# Patient Record
Sex: Male | Born: 1950 | Race: Black or African American | Hispanic: No | Marital: Married | Smoking: Never smoker
Health system: Southern US, Community
[De-identification: ages and names within clinical notes are randomized; demographics above are authoritative.]

## PROBLEM LIST (undated history)

## (undated) DIAGNOSIS — E119 Type 2 diabetes mellitus without complications: Secondary | ICD-10-CM

## (undated) DIAGNOSIS — I10 Essential (primary) hypertension: Secondary | ICD-10-CM

## (undated) DIAGNOSIS — Z8739 Personal history of other diseases of the musculoskeletal system and connective tissue: Secondary | ICD-10-CM

## (undated) DIAGNOSIS — E785 Hyperlipidemia, unspecified: Secondary | ICD-10-CM

## (undated) DIAGNOSIS — C61 Malignant neoplasm of prostate: Secondary | ICD-10-CM

## (undated) DIAGNOSIS — R972 Elevated prostate specific antigen [PSA]: Secondary | ICD-10-CM

## (undated) DIAGNOSIS — N189 Chronic kidney disease, unspecified: Secondary | ICD-10-CM

## (undated) HISTORY — PX: NO PAST SURGERIES: SHX2092

---

## 2003-12-11 ENCOUNTER — Ambulatory Visit (HOSPITAL_COMMUNITY): Admission: RE | Admit: 2003-12-11 | Discharge: 2003-12-11 | Payer: Self-pay | Admitting: Family Medicine

## 2003-12-11 IMAGING — CR DG WRIST COMPLETE 3+V*L*
1 series · 1 of 1 positions shown · non-contrast
Comparison: none

CLINICAL DATA: Patient with swollen left wrist.  No known injury.  
 LEFT WRIST, FOUR VIEWS
 There is mild diffuse soft tissue swelling.  There is bony spurring particularly in the lateral portion of the left wrist.  No definite areas of soft tissue calcification.  No fracture.  
 IMPRESSION
 1.  Mild soft tissue swelling.  No acute abnormality. 
 2.  Degenerative changes are noted particularly laterally.

[view not recorded]
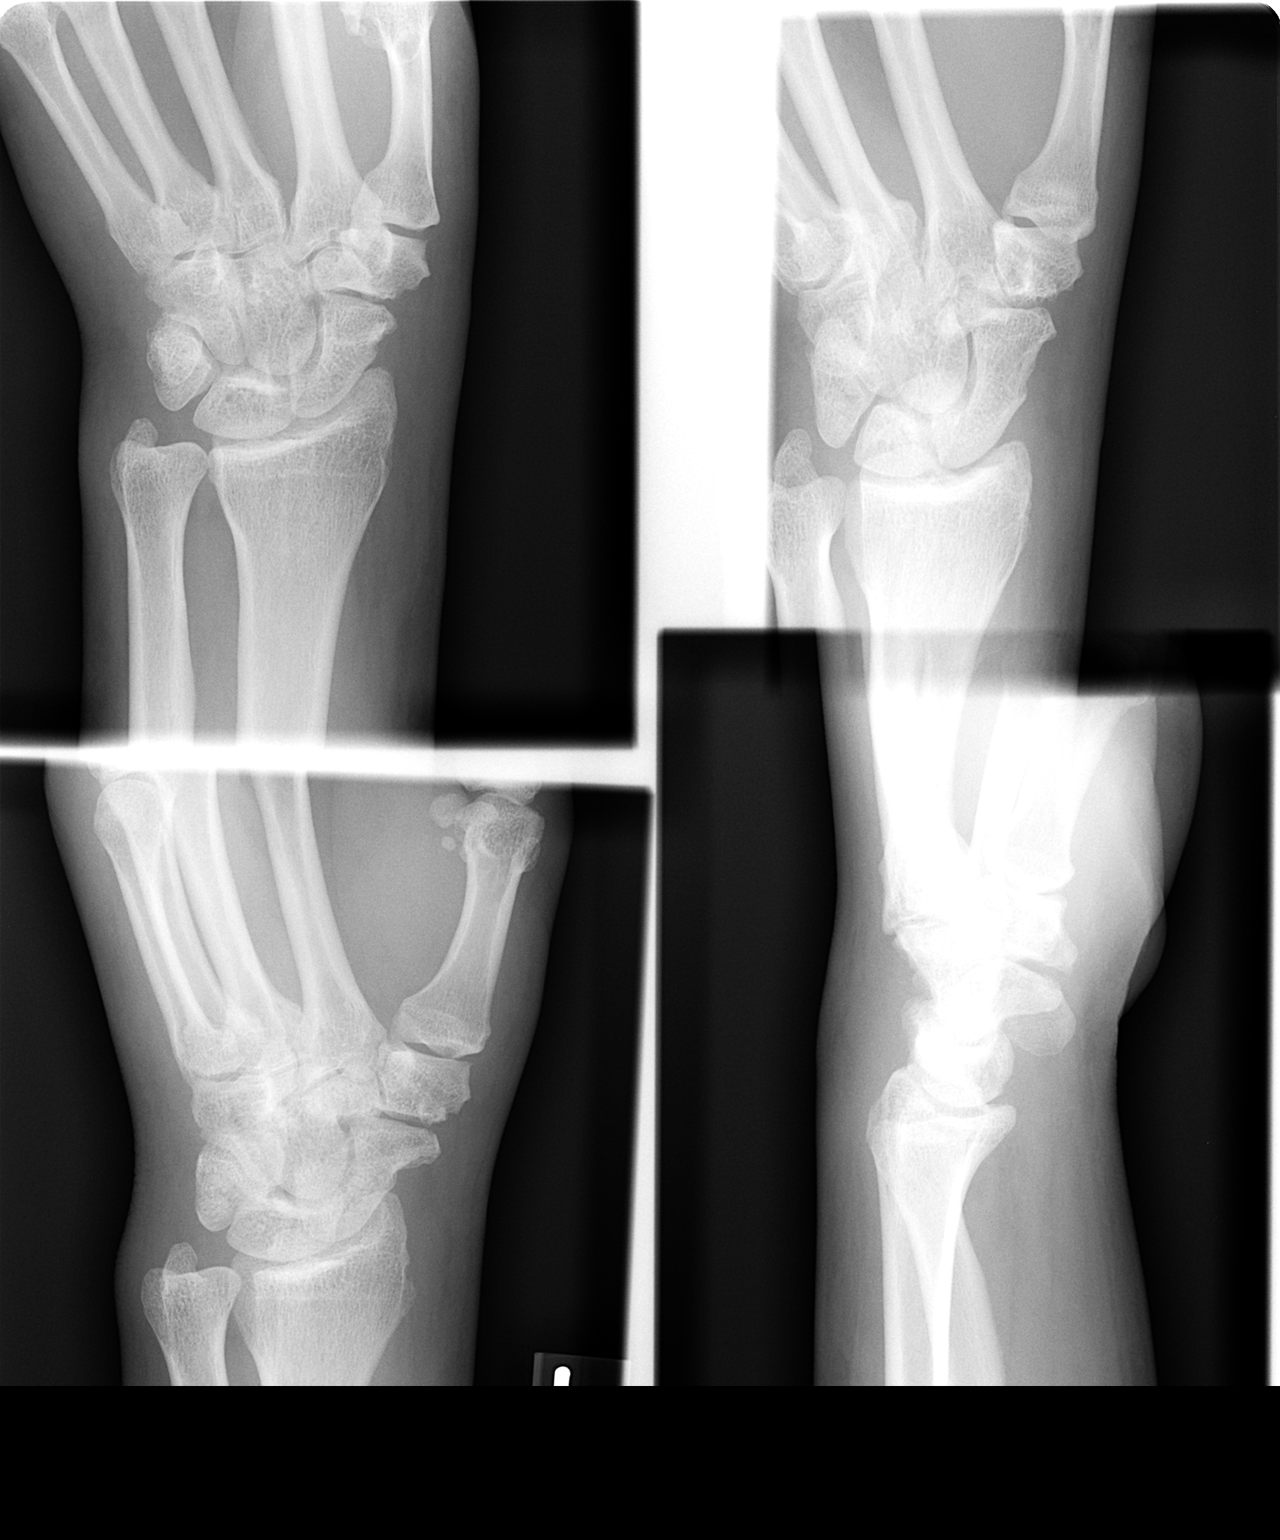

[1 of 1 positions shown; findings below may reference images not displayed]

## 2008-06-11 IMAGING — CR DG CHEST 2V
2 series · 2 of 2 positions shown · non-contrast
Comparison: None

CLINICAL DATA: Chest pain.  Abnormal EKG.

CHEST - 2 VIEW

[w chest pa]
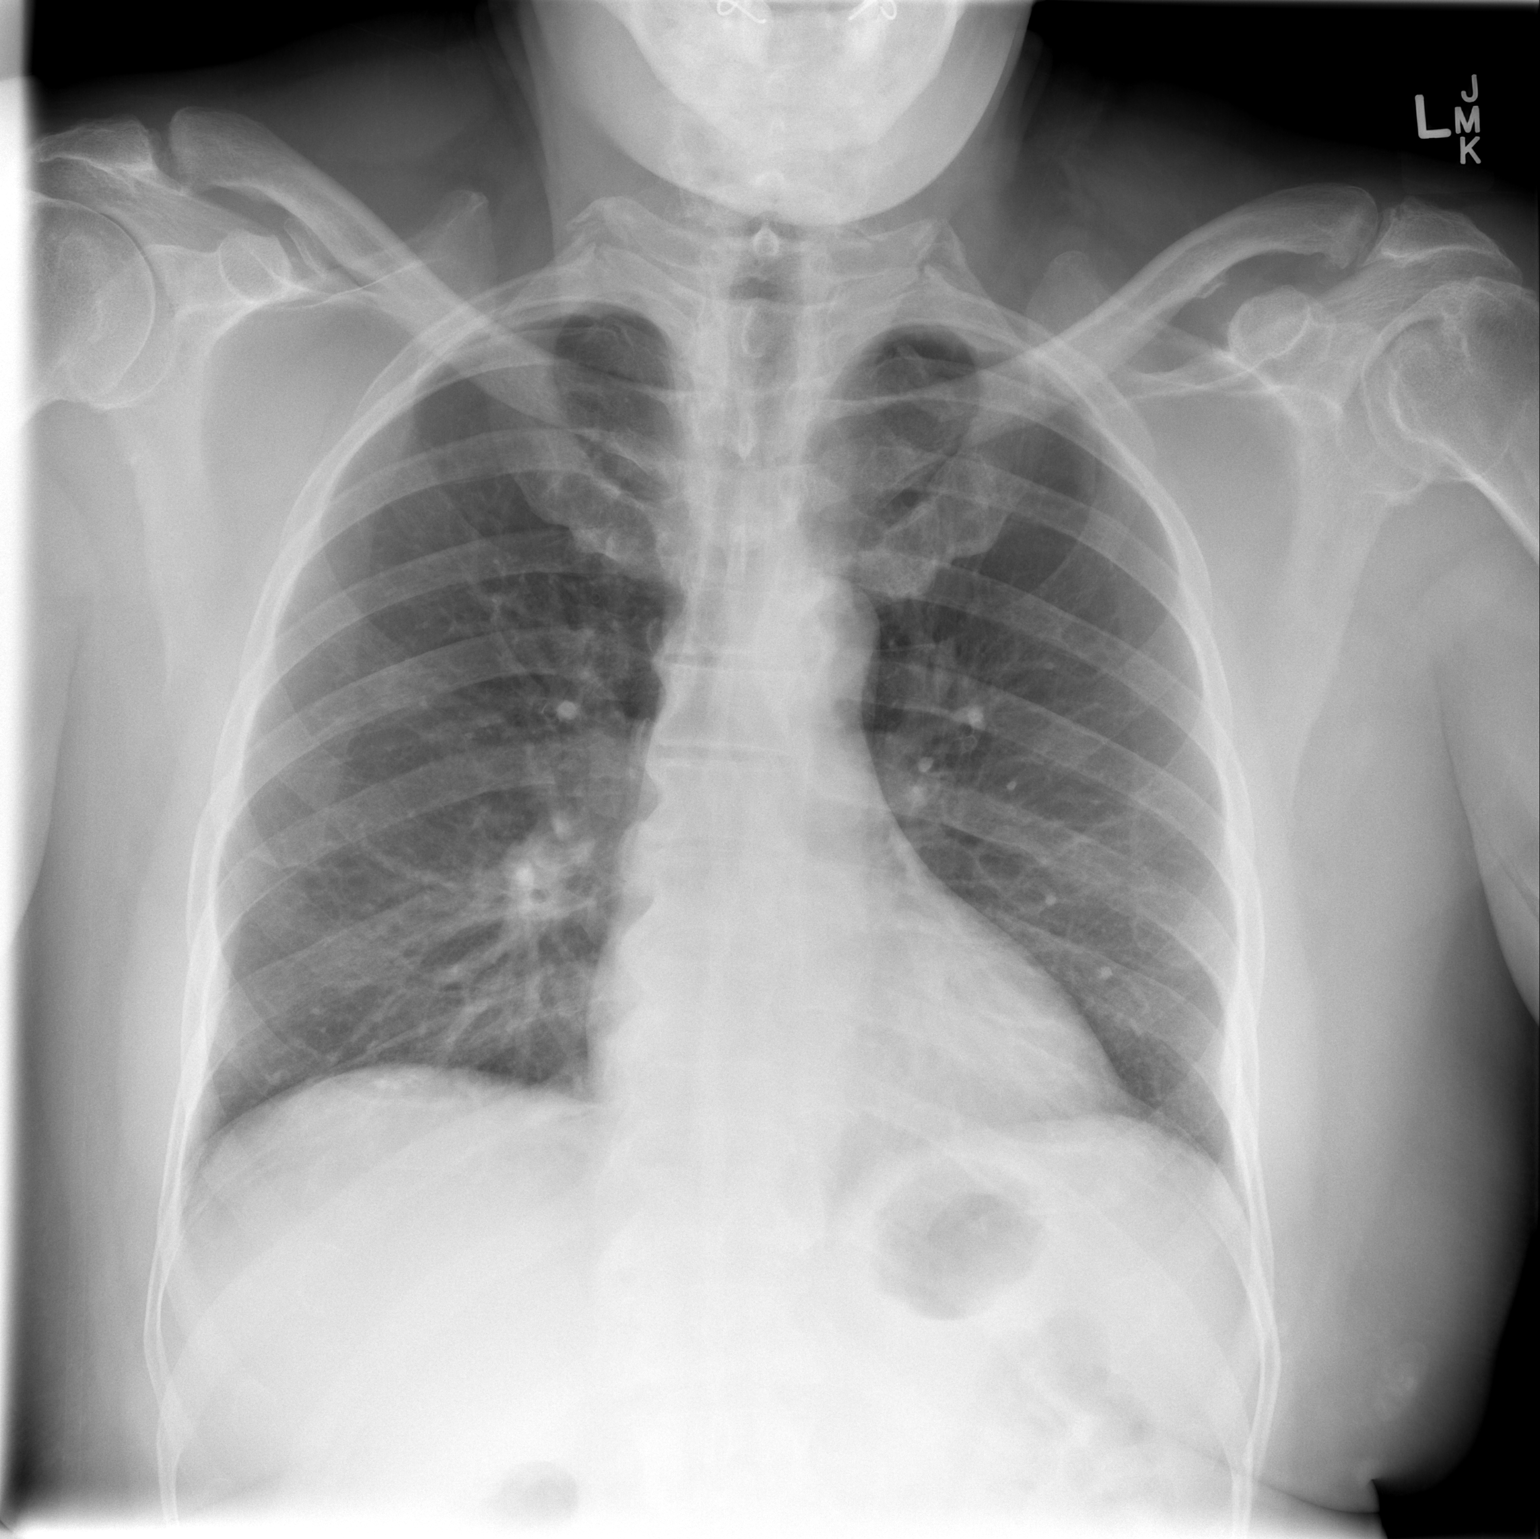

[w chest lat]
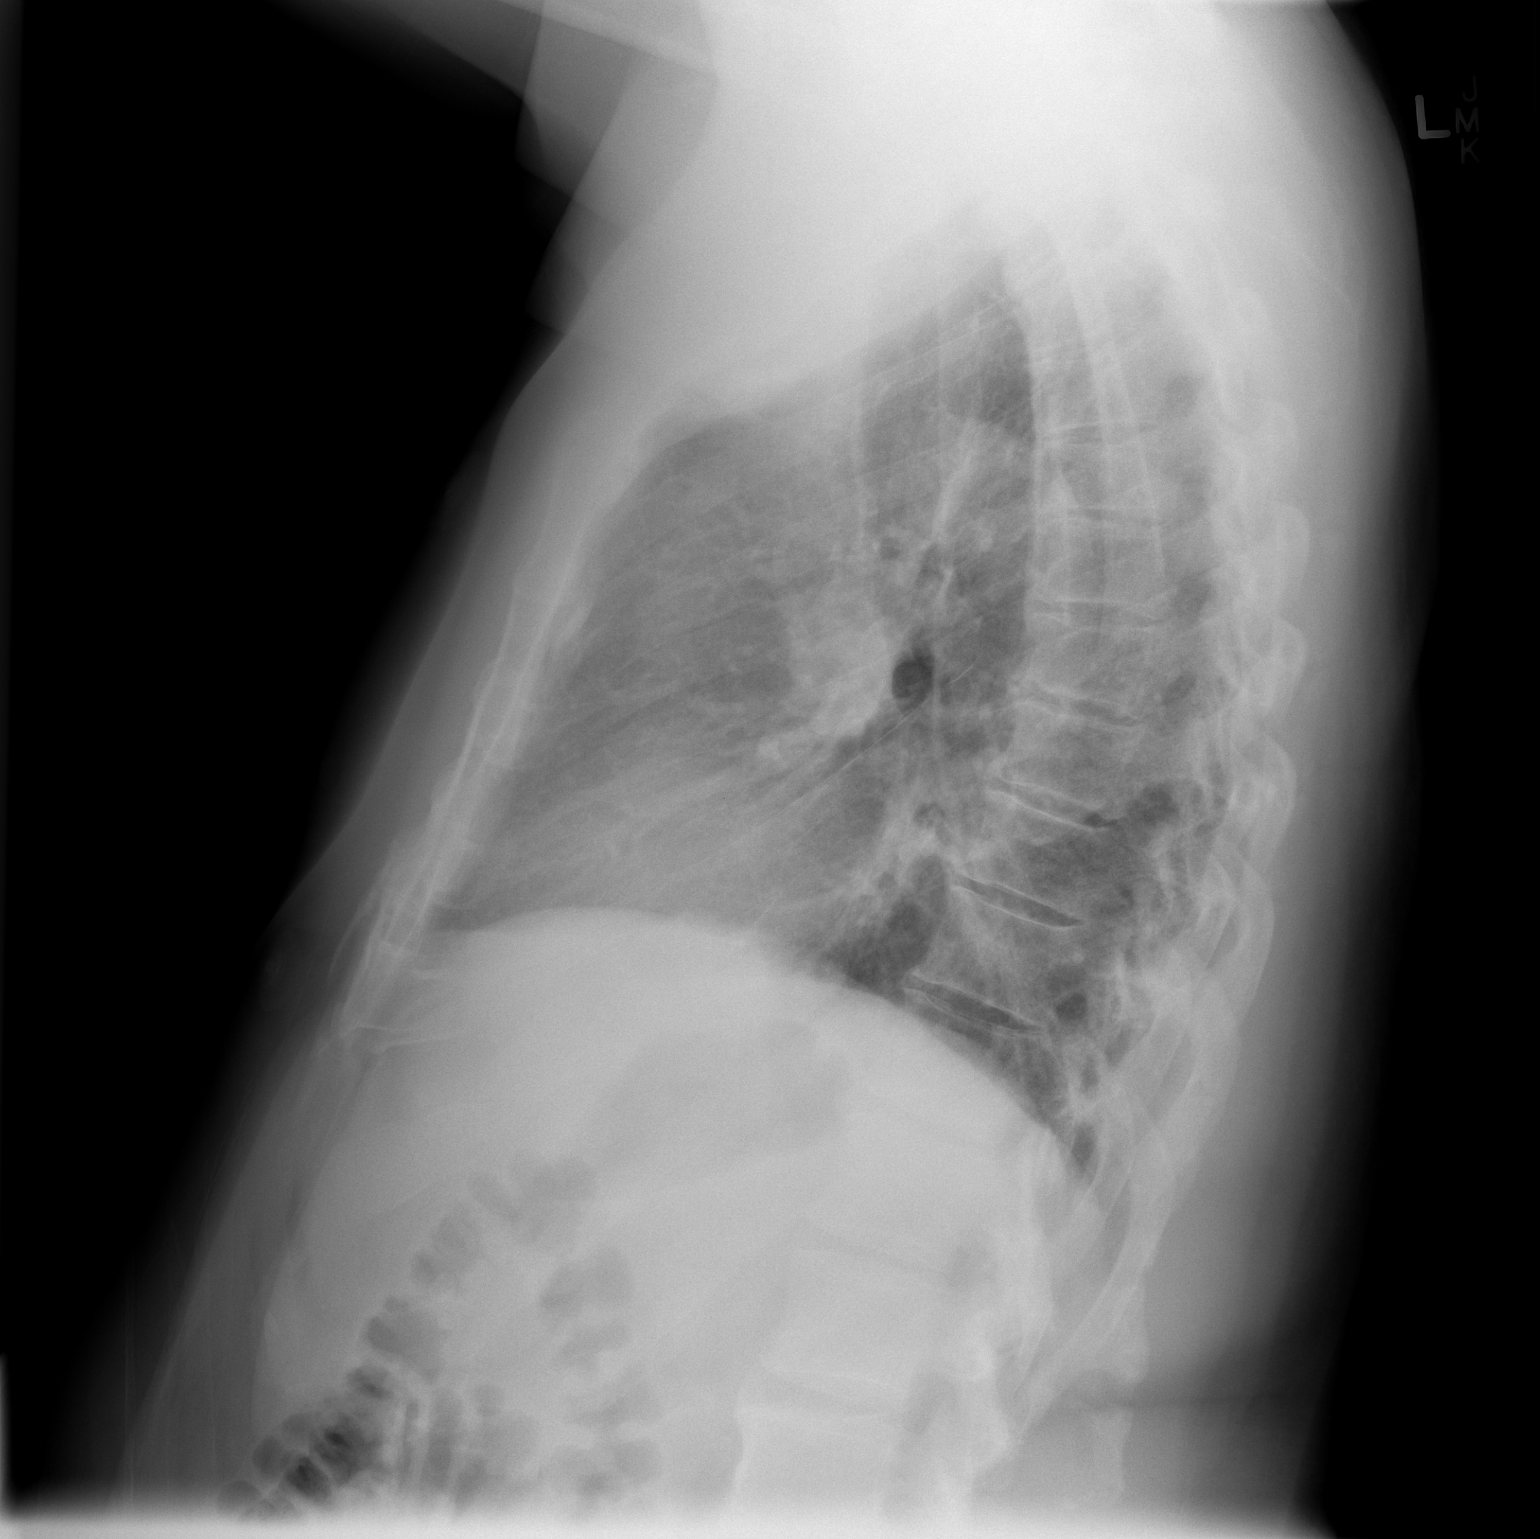

[2 of 2 positions shown; findings below may reference images not displayed]

FINDINGS: The heart size and mediastinal contours are within normal
limits.  Both lungs are clear.  The visualized skeletal structures
are unremarkable.
IMPRESSION: No active cardiopulmonary disease.

## 2008-06-12 ENCOUNTER — Observation Stay (HOSPITAL_COMMUNITY): Admission: EM | Admit: 2008-06-12 | Discharge: 2008-06-12 | Payer: Self-pay | Admitting: Emergency Medicine

## 2009-03-08 DEATH — deceased

## 2009-12-06 ENCOUNTER — Encounter: Admission: RE | Admit: 2009-12-06 | Discharge: 2009-12-06 | Payer: Self-pay | Admitting: Family Medicine

## 2010-05-01 ENCOUNTER — Emergency Department (HOSPITAL_COMMUNITY): Admission: EM | Admit: 2010-05-01 | Discharge: 2010-05-02 | Payer: Self-pay | Admitting: Emergency Medicine

## 2010-07-21 ENCOUNTER — Emergency Department (HOSPITAL_COMMUNITY)
Admission: EM | Admit: 2010-07-21 | Discharge: 2010-07-22 | Disposition: A | Discharge status: Home / Self Care | Payer: BC Managed Care – PPO | Attending: Emergency Medicine | Admitting: Emergency Medicine

## 2010-07-21 DIAGNOSIS — E785 Hyperlipidemia, unspecified: Secondary | ICD-10-CM | POA: Insufficient documentation

## 2010-07-21 DIAGNOSIS — R631 Polydipsia: Secondary | ICD-10-CM | POA: Insufficient documentation

## 2010-07-21 DIAGNOSIS — R110 Nausea: Secondary | ICD-10-CM | POA: Insufficient documentation

## 2010-07-21 DIAGNOSIS — R5381 Other malaise: Secondary | ICD-10-CM | POA: Insufficient documentation

## 2010-07-21 DIAGNOSIS — I10 Essential (primary) hypertension: Secondary | ICD-10-CM | POA: Insufficient documentation

## 2010-07-21 DIAGNOSIS — R3589 Other polyuria: Secondary | ICD-10-CM | POA: Insufficient documentation

## 2010-07-21 DIAGNOSIS — E119 Type 2 diabetes mellitus without complications: Secondary | ICD-10-CM | POA: Insufficient documentation

## 2010-07-21 DIAGNOSIS — R5383 Other fatigue: Secondary | ICD-10-CM | POA: Insufficient documentation

## 2010-07-21 DIAGNOSIS — R358 Other polyuria: Secondary | ICD-10-CM | POA: Insufficient documentation

## 2010-07-21 LAB — POCT I-STAT 3, VENOUS BLOOD GAS (G3P V)
Acid-Base Excess: 1 mmol/L (ref 0.0–2.0)
Bicarbonate: 27.4 mEq/L — ABNORMAL HIGH (ref 20.0–24.0)
O2 Saturation: 52 %
O2 Saturation: 44 %
TCO2: 29 mmol/L (ref 0–100)
pCO2, Ven: 50.8 mmHg — ABNORMAL HIGH (ref 45.0–50.0)
pH, Ven: 7.329 — ABNORMAL HIGH (ref 7.250–7.300)
pH, Ven: 7.343 — ABNORMAL HIGH (ref 7.250–7.300)
pO2, Ven: 30 mmHg (ref 30.0–45.0)
pO2, Ven: 26 mmHg — CL (ref 30.0–45.0)

## 2010-07-21 LAB — DIFFERENTIAL
Basophils Absolute: 0 10*3/uL (ref 0.0–0.1)
Basophils Relative: 0 % (ref 0–1)
Eosinophils Relative: 0 % (ref 0–5)
Lymphocytes Relative: 14 % (ref 12–46)
Monocytes Absolute: 0.5 10*3/uL (ref 0.1–1.0)

## 2010-07-21 LAB — URINALYSIS, ROUTINE W REFLEX MICROSCOPIC
Protein, ur: NEGATIVE mg/dL
Specific Gravity, Urine: >1.046 — ABNORMAL HIGH (ref 1.005–1.030)
Urine Glucose, Fasting: >1000 mg/dL — AB
pH: 5 (ref 5.0–8.0)

## 2010-07-21 LAB — GLUCOSE, CAPILLARY
Glucose-Capillary: 286 mg/dL — ABNORMAL HIGH (ref 70–99)
Glucose-Capillary: 288 mg/dL — ABNORMAL HIGH (ref 70–99)
Glucose-Capillary: 313 mg/dL — ABNORMAL HIGH (ref 70–99)

## 2010-07-21 LAB — BASIC METABOLIC PANEL
BUN: 16 mg/dL (ref 6–23)
CO2: 24 mEq/L (ref 19–32)
GFR calc non Af Amer: >60 mL/min (ref >60)
Glucose, Bld: 391 mg/dL — ABNORMAL HIGH (ref 70–99)
Potassium: 4.9 mEq/L (ref 3.5–5.1)
Sodium: 141 mEq/L (ref 135–145)

## 2010-07-21 LAB — URINE MICROSCOPIC-ADD ON

## 2010-07-21 LAB — CBC
HCT: 50.5 % (ref 39.0–52.0)
MCH: 29.7 pg (ref 26.0–34.0)
Platelets: 150 10*3/uL (ref 150–400)
WBC: 9.6 10*3/uL (ref 4.0–10.5)

## 2010-09-22 LAB — LIPID PANEL
LDL Cholesterol: 77 mg/dL (ref 0–99)
Total CHOL/HDL Ratio: 5.3 RATIO
VLDL: 25 mg/dL (ref 0–40)

## 2010-09-22 LAB — COMPREHENSIVE METABOLIC PANEL
ALT: 28 U/L (ref 0–53)
AST: 25 U/L (ref 0–37)
Albumin: 3.3 g/dL — ABNORMAL LOW (ref 3.5–5.2)
Alkaline Phosphatase: 76 U/L (ref 39–117)
Alkaline Phosphatase: 79 U/L (ref 39–117)
BUN: 12 mg/dL (ref 6–23)
CO2: 27 mEq/L (ref 19–32)
Calcium: 9.5 mg/dL (ref 8.4–10.5)
Chloride: 106 mEq/L (ref 96–112)
Chloride: 106 mEq/L (ref 96–112)
Creatinine, Ser: 1 mg/dL (ref 0.4–1.5)
GFR calc Af Amer: >60 mL/min (ref >60)
GFR calc Af Amer: >60 mL/min (ref >60)
GFR calc non Af Amer: >60 mL/min (ref >60)
GFR calc non Af Amer: >60 mL/min (ref >60)
Glucose, Bld: 119 mg/dL — ABNORMAL HIGH (ref 70–99)
Potassium: 3.5 mEq/L (ref 3.5–5.1)
Sodium: 138 mEq/L (ref 135–145)

## 2010-09-22 LAB — PROTIME-INR
INR: 1.2 (ref 0.00–1.49)
Prothrombin Time: 15.3 seconds — ABNORMAL HIGH (ref 11.6–15.2)

## 2010-09-22 LAB — CARDIAC PANEL(CRET KIN+CKTOT+MB+TROPI)
CK, MB: 1.6 ng/mL (ref 0.3–4.0)
CK, MB: 1.8 ng/mL (ref 0.3–4.0)
Relative Index: INVALID (ref 0.0–2.5)
Relative Index: INVALID (ref 0.0–2.5)
Total CK: 71 U/L (ref 7–232)
Total CK: 98 U/L (ref 7–232)
Troponin I: 0.01 ng/mL (ref 0.00–0.06)
Troponin I: 0.02 ng/mL (ref 0.00–0.06)

## 2010-09-22 LAB — HEMOGLOBIN A1C
Hgb A1c MFr Bld: 6.8 % — ABNORMAL HIGH (ref 4.6–6.1)
Mean Plasma Glucose: 148 mg/dL

## 2010-09-22 LAB — CBC
HCT: 44.8 % (ref 39.0–52.0)
Hemoglobin: 13.7 g/dL (ref 13.0–17.0)
Platelets: 172 10*3/uL (ref 150–400)
RBC: 4.85 MIL/uL (ref 4.22–5.81)
WBC: 6.8 10*3/uL (ref 4.0–10.5)

## 2010-09-22 LAB — DIFFERENTIAL
Eosinophils Absolute: 0.1 10*3/uL (ref 0.0–0.7)
Eosinophils Relative: 1 % (ref 0–5)
Lymphs Abs: 1.5 10*3/uL (ref 0.7–4.0)
Neutro Abs: 4.6 10*3/uL (ref 1.7–7.7)

## 2010-09-22 LAB — POCT CARDIAC MARKERS
CKMB, poc: <1 ng/mL — ABNORMAL LOW (ref 1.0–8.0)
Troponin i, poc: <0.05 ng/mL (ref 0.00–0.09)

## 2010-09-22 LAB — APTT: aPTT: 30 seconds (ref 24–37)

## 2010-10-15 ENCOUNTER — Ambulatory Visit (HOSPITAL_COMMUNITY)
Admission: RE | Admit: 2010-10-15 | Discharge: 2010-10-15 | Disposition: A | Discharge status: Home / Self Care | Payer: BC Managed Care – PPO | Source: Ambulatory Visit | Attending: Family Medicine | Admitting: Family Medicine

## 2010-10-15 DIAGNOSIS — M7989 Other specified soft tissue disorders: Secondary | ICD-10-CM | POA: Insufficient documentation

## 2010-10-21 NOTE — H&P (Signed)
NAME:  Garrett Snyder, Garrett Snyder NO.:  [id number]   MEDICAL RECORD NO.:  [id number]          PATIENT TYPE:  EMS   LOCATION:  MAJO                         FACILITY:  [facility]   PHYSICIAN:  Michiel Cowboy, MDDATE OF BIRTH:  1951/03/16   DATE OF ADMISSION:  06/12/2008  DATE OF DISCHARGE:                              HISTORY & PHYSICAL   PRIMARY CARE PHYSICIAN:  Dr. [name].   CHIEF COMPLAINT:  Chest pain.   HISTORY OF PRESENT ILLNESS:  The patient is a 60 year old gentleman with  history of hypertension, hyperlipidemia who experienced a chest pain  while sitting on the edge of his bed for 5-10 seconds about a week ago.  He presented to his primary care Shonta Bourque who obtained EKG that showed  some flipped T waves in leads 2 and 3, at which point the patient was  sent to the emergency department in ambulance for further evaluation.  Of note, he does have some early repolarization in leads V2 and V3 with  slightly peaked T waves there.  Otherwise, no known specific EKG  changes.  With repeat EKG here, his T wave inversions have resolved.  He  has still slightly peaked T waves with early repolarization in leads V2,  V3, but otherwise, unremarkable EKG.   [facility] hospitalist was called for further admission.   SOCIAL HISTORY:  The patient never smoked or drank alcohol.  Lives at  home with his wife.   REVIEW OF SYSTEMS:  He denies any shortness of breath.  No fever,  chills, nausea, vomiting.  No constipation.  Otherwise, review of  systems is unremarkable.   PAST MEDICAL HISTORY:  1. Hypertension.  2. Hyperlipidemia.   FAMILY HISTORY:  Significant for mother with coronary artery disease in  her 31s.  Otherwise, unremarkable.   ALLERGIES:  No known drug allergies.   MEDICATIONS:  1. Lisinopril 20 mg daily.  2. Simvastatin 40 mg daily.   PHYSICAL EXAMINATION:  VITAL SIGNS:  Temperature 98.2, blood pressure  129/85, pulse 84, respirations 20, saturating 98% on room  air.  GENERAL:  The patient appears to be in no acute distress.  HEENT:  Head nontraumatic.  Mucous membranes moist.  HEART:  Regular rate and rhythm.  No murmurs appreciated.  LUNGS:  Clear to auscultation bilaterally.  ABDOMEN:  Slightly obese but nontender, nondistended.  EXTREMITIES:  Lower extremities without cyanosis, clubbing or edema.  NEUROLOGICAL:  Cranial nerves II-XII intact.  Strength 5/5 in all four  extremities.   LABORATORY DATA:  White blood cell count 6.8, hemoglobin 14.6.  Sodium  141, potassium 3.6, creatinine 1.0.  Chest x-ray showing no active  cardiopulmonary disease.  Repeat EKG here showing resolution of T wave  inversions in lead 2 and lead 3, and early repolarization in lead V2 and  V3.  Otherwise, unremarkable.  There are some nonspecific EKG changes  which are unchanged from EKG obtained at 7:30 tonight.   ASSESSMENT/PLAN:  1. Chest pain, extremely atypical and likely noncardiac in etiology,      but given slight EKG changes, will admit for observation, cycle  cardiac enzymes.  Obtain fasting lipid panel, hemoglobin A1C, check      TSH.  Continue Lisinopril and Zocor.  Will refer to outpatient      stress test when able to schedule this appointment.  2. History of hypertension, hyperlipidemia.  Continue home      medications.  3. Prophylaxis Protonix with Lovenox.      Michiel Cowboy, MD  Electronically Signed     AVD/MEDQ  D:  06/12/2008  T:  06/12/2008  Job:  [id number]   cc:   Elana Alm. [name], M.D.

## 2010-10-24 NOTE — Discharge Summary (Signed)
NAME:  Garrett Snyder, Garrett Snyder NO.:  [id number]   MEDICAL RECORD NO.:  [id number]          PATIENT TYPE:  OBV   LOCATION:  6707                         FACILITY:  [facility]   PHYSICIAN:  Corinna L. Lendell Caprice, MDDATE OF BIRTH:  10/03/1950   DATE OF ADMISSION:  06/11/2008  DATE OF DISCHARGE:  06/12/2008                               DISCHARGE SUMMARY   DISCHARGE DIAGNOSES:  1. Atypical chest pain.  2. Elevated blood glucose.  3. Hypertension.  4. Hyperlipidemia.   DISCHARGE MEDICATIONS:  Aspirin 81 mg a day.  Ibuprofen 400 mg every 4  hours as needed for pain.  Continue lisinopril 20 mg a day, Zocor 40 mg  a day.   Diet should be heart-healthy.   Activity ad lib.   Follow up with Dr. [name] in 4 weeks to evaluate for elevated blood  glucose.   Condition stable.   CONSULTATIONS:  None.   PROCEDURES:  None.   Labs, CBC unremarkable.  PT/PTT normal.  Basic metabolic panel on  admission was normal but repeat showed glucose of 206, hemoglobin A1c  6.8.  Liver function tests unremarkable.  Serial cardiac enzymes  negative.  Total cholesterol was 126.  LDL 78, HDL 24, triglycerides  124, TSH of 0.913.  Special studies/radiology:  EKG  showed normal sinus  rhythm.  Chest X-Ray:  Two views showed nothing acute.   HISTORY AND HOSPITAL COURSE:  Mr. Jeanella Craze is a 60 year old patient of Dr.  [name] who was sent to the emergency room with atypical chest pain.  He  has a history of hypertension, hyperlipidemia.  His chest pain lasted  just a few seconds and occurred several days prior to admission.  Apparently, he had an EKG in the office that showed some flipped T-  waves.  I do not have that EKG currently.  But when he was seen in the  emergency room, he had no chest pain and his EKG was normal.  He was  placed on 23-hour observation.  He was noted to have an  elevated blood glucose on basic metabolic panel and this will need to be  followed up.  Due to his hypertension,  hyperlipidemia and possible  diabetes, I have placed him on aspirin a day.  I suspect his pain is  musculoskeletal and he can take ibuprofen as needed for pain.  If he  continues, consider outpatient stress test.      Corinna L. Lendell Caprice, MD  Electronically Signed     CLS/MEDQ  D:  07/18/2008  T:  07/18/2008  Job:  [id number]

## 2012-10-10 ENCOUNTER — Emergency Department (HOSPITAL_COMMUNITY)
Admission: EM | Admit: 2012-10-10 | Discharge: 2012-10-11 | Disposition: A | Discharge status: Home / Self Care | Payer: BC Managed Care – PPO | Attending: Emergency Medicine | Admitting: Emergency Medicine

## 2012-10-10 ENCOUNTER — Emergency Department (HOSPITAL_COMMUNITY): Payer: BC Managed Care – PPO

## 2012-10-10 ENCOUNTER — Encounter (HOSPITAL_COMMUNITY): Payer: Self-pay | Admitting: *Deleted

## 2012-10-10 DIAGNOSIS — R071 Chest pain on breathing: Secondary | ICD-10-CM | POA: Insufficient documentation

## 2012-10-10 DIAGNOSIS — E119 Type 2 diabetes mellitus without complications: Secondary | ICD-10-CM | POA: Insufficient documentation

## 2012-10-10 DIAGNOSIS — I10 Essential (primary) hypertension: Secondary | ICD-10-CM | POA: Insufficient documentation

## 2012-10-10 DIAGNOSIS — Z791 Long term (current) use of non-steroidal anti-inflammatories (NSAID): Secondary | ICD-10-CM | POA: Insufficient documentation

## 2012-10-10 DIAGNOSIS — Z79899 Other long term (current) drug therapy: Secondary | ICD-10-CM | POA: Insufficient documentation

## 2012-10-10 DIAGNOSIS — R0789 Other chest pain: Secondary | ICD-10-CM

## 2012-10-10 HISTORY — DX: Type 2 diabetes mellitus without complications: E11.9

## 2012-10-10 HISTORY — DX: Essential (primary) hypertension: I10

## 2012-10-10 LAB — BASIC METABOLIC PANEL
BUN: 19 mg/dL (ref 6–23)
CO2: 24 mEq/L (ref 19–32)
Calcium: 9.6 mg/dL (ref 8.4–10.5)
Glucose, Bld: 110 mg/dL — ABNORMAL HIGH (ref 70–99)
Sodium: 143 mEq/L (ref 135–145)

## 2012-10-10 LAB — POCT I-STAT TROPONIN I

## 2012-10-10 LAB — CBC
HCT: 44.5 % (ref 39.0–52.0)
Hemoglobin: 14.3 g/dL (ref 13.0–17.0)
MCH: 27.6 pg (ref 26.0–34.0)
RBC: 5.19 MIL/uL (ref 4.22–5.81)

## 2012-10-10 LAB — D-DIMER, QUANTITATIVE: D-Dimer, Quant: <0.27 ug/mL-FEU (ref 0.00–0.48)

## 2012-10-10 IMAGING — CR DG CHEST 2V
2 series · 2 of 2 positions shown · non-contrast
Comparison: [DATE].

CLINICAL DATA: 62-year-old male chest pain.

CHEST - 2 VIEW

[w chest pa]
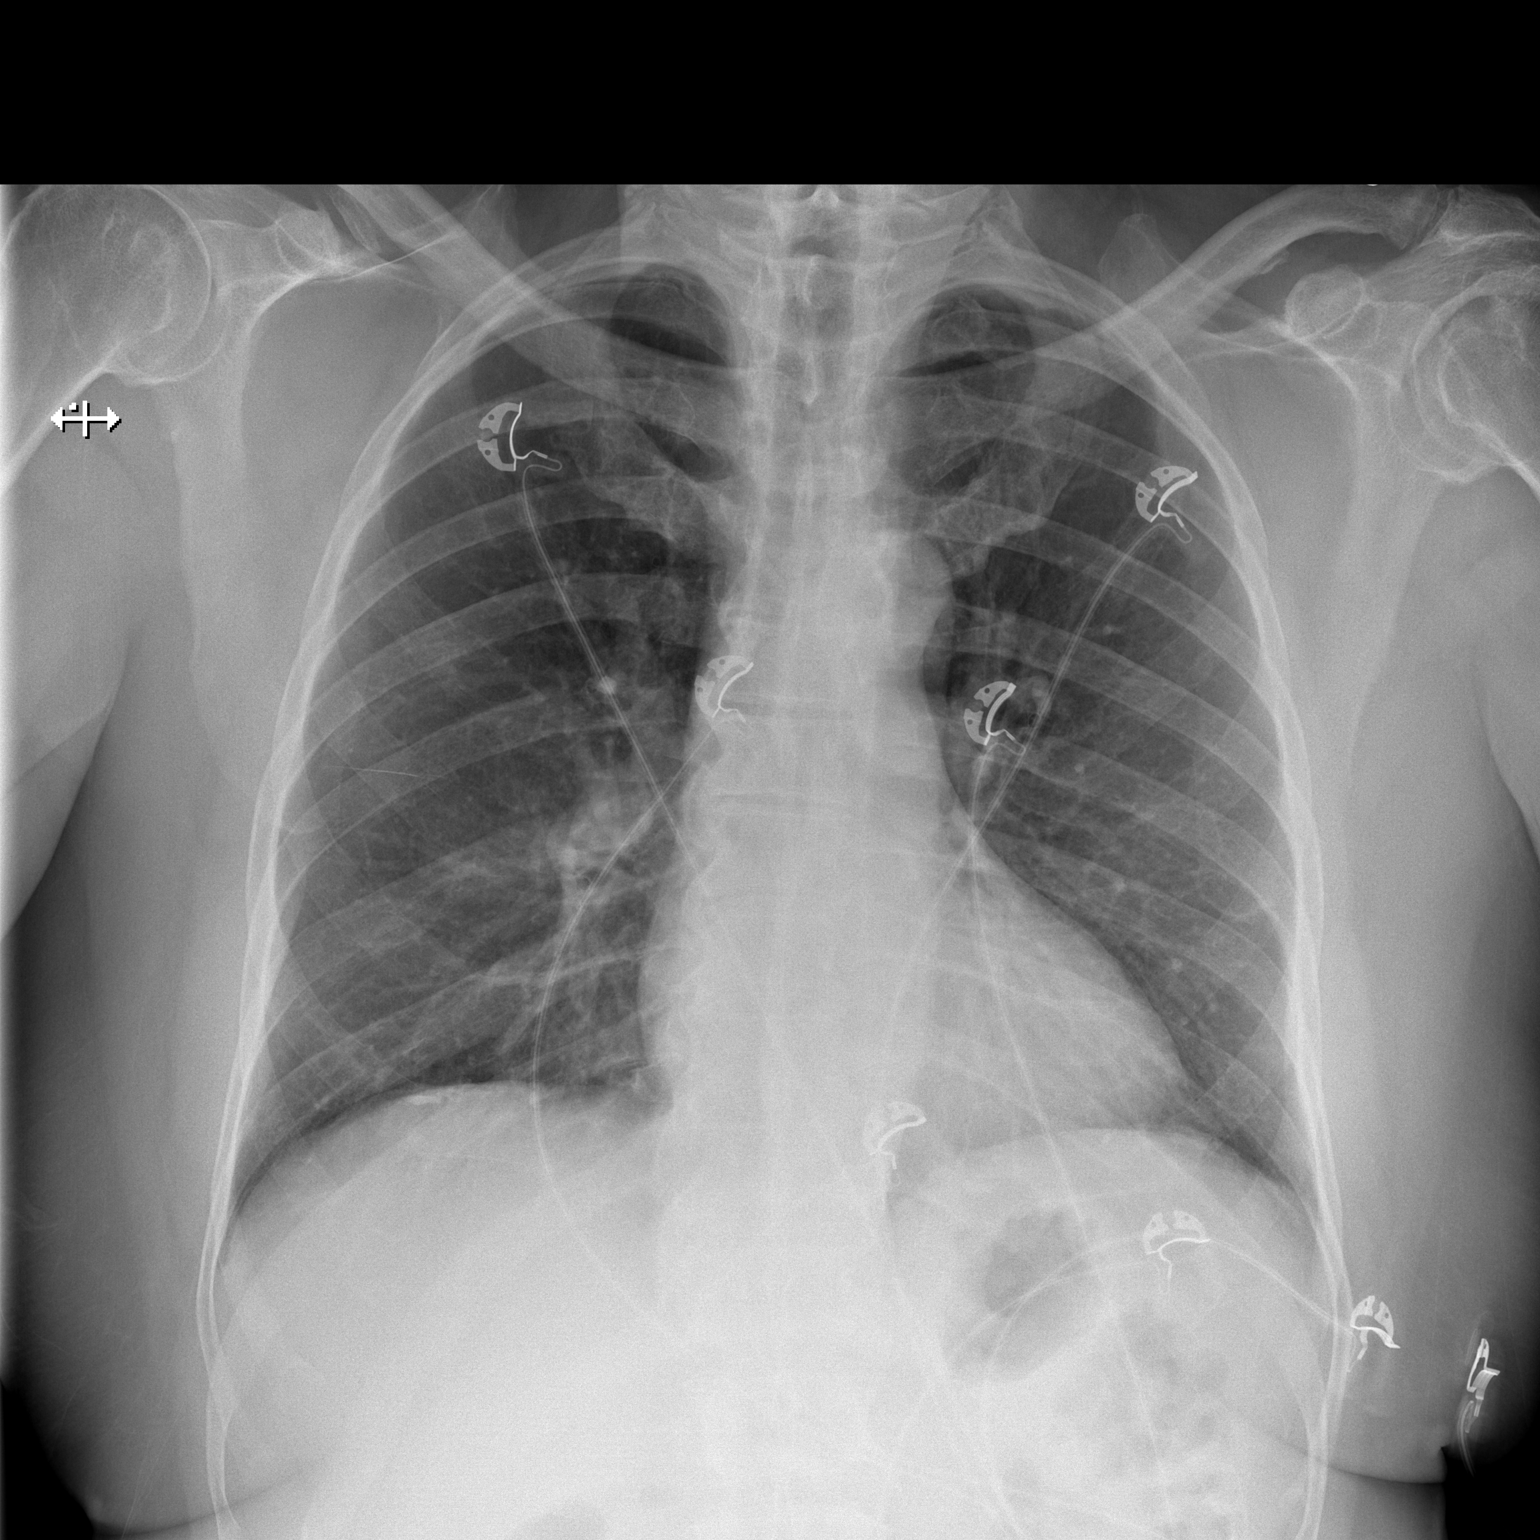

[w chest lat]
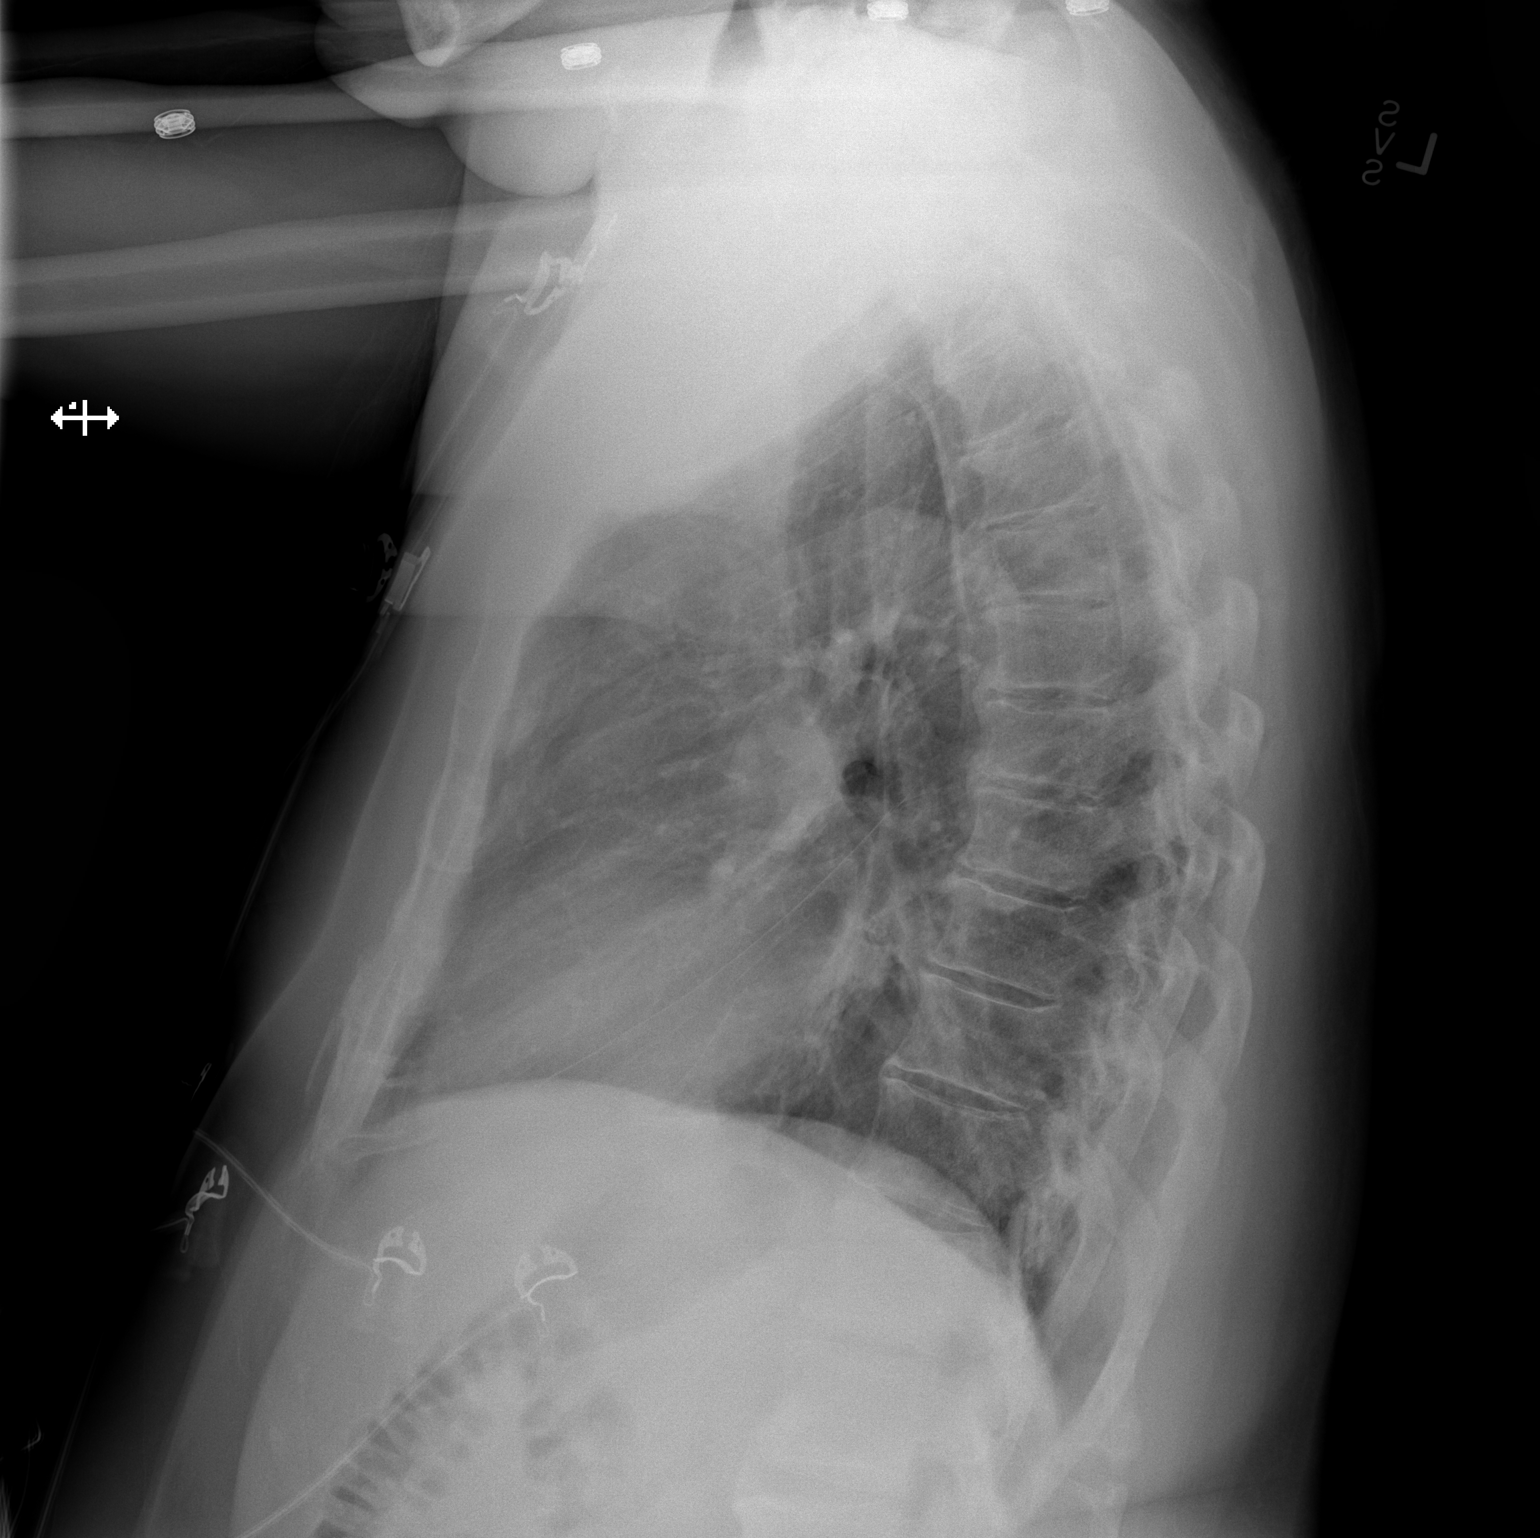

[2 of 2 positions shown; findings below may reference images not displayed]

FINDINGS: Larger lung volumes.  Cardiac size and mediastinal
contours are within normal limits.  Visualized tracheal air column
is within normal limits.  No pneumothorax or pulmonary edema.  No
pleural effusion or confluent pulmonary opacity. No acute osseous
abnormality identified.
IMPRESSION: No acute cardiopulmonary abnormality.

## 2012-10-10 NOTE — ED Provider Notes (Signed)
History     CSN: [id number]  Arrival date & time 10/10/12  1815   First MD Initiated Contact with Patient 10/10/12 2136      Chief Complaint  Patient presents with  . Chest Pain    (Consider location/radiation/quality/duration/timing/severity/associated sxs/prior treatment) HPI Pt presenting with c/o left sided chest soreness. Pains have been intermittent over the past 2 days.  Worse with bending over/certain movements and palpation of his left chest.  No nausea, no sob, no diaphoresis.  NO fever or cough.  No association with exertion.  Pt states he has had a normal stress test since last admission to the hospital where he was felt to have musculoskeletal chest pain.  He has not taken anything for his symptoms. He does not currently have any pain.  No hx of DVT/PE, no recent travel/trauma/surgery.  There are no other associated systemic symptoms, there are no other alleviating or modifying factors.   Past Medical History  Diagnosis Date  . Hypertension   . Diabetes mellitus without complication     History reviewed. No pertinent past surgical history.  No family history on file.  History  Substance Use Topics  . Smoking status: Never Smoker   . Smokeless tobacco: Never Used  . Alcohol Use: No      Review of Systems ROS reviewed and all otherwise negative except for mentioned in HPI  Allergies  Review of patient's allergies indicates no known allergies.  Home Medications   Current Outpatient Rx  Name  Route  Sig  Dispense  Refill  . lisinopril (PRINIVIL,ZESTRIL) 20 MG tablet   Oral   Take 20 mg by mouth daily.         . metFORMIN (GLUCOPHAGE) 1000 MG tablet   Oral   Take 1,000 mg by mouth 2 (two) times daily with a meal.         . naproxen sodium (ANAPROX) 550 MG tablet   Oral   Take 550 mg by mouth 2 (two) times daily with a meal.         . saxagliptin HCl (ONGLYZA) 5 MG TABS tablet   Oral   Take 5 mg by mouth daily.         . simvastatin (ZOCOR)  40 MG tablet   Oral   Take 40 mg by mouth every evening.           BP 156/94  Pulse 80  Temp(Src) 97.9 F (36.6 C) (Oral)  Resp 18  SpO2 98% Vitals reviewed Physical Exam Physical Examination: General appearance - alert, well appearing, and in no distress Mental status - alert, oriented to person, place, and time Eyes - no scleral icterus, no conjunctival injection Mouth - mucous membranes moist, pharynx normal without lesions Chest - clear to auscultation, no wheezes, rales or rhonchi, symmetric air entry, ttp over pec major distribution Heart - normal rate, regular rhythm, normal S1, S2, no murmurs, rubs, clicks or gallops Abdomen - soft, nontender, nondistended, no masses or organomegaly Extremities - peripheral pulses normal, no pedal edema, no clubbing or cyanosis Skin - normal coloration and turgor, no rashes  ED Course  Procedures (including critical care time)    Date: 10/10/2012  Rate: 90  Rhythm: normal sinus rhythm  QRS Axis: normal  Intervals: normal  ST/T Wave abnormalities: normal  Conduction Disutrbances: none  Narrative Interpretation: unremarkable, no significant change from prior EKG      Labs Reviewed  BASIC METABOLIC PANEL - Abnormal; Notable for the following:  Glucose, Bld 110 (*)    GFR calc non Af Amer 67 (*)    GFR calc Af Amer 78 (*)    All other components within normal limits  CBC  D-DIMER, QUANTITATIVE  POCT I-STAT TROPONIN I  POCT I-STAT TROPONIN I   Dg Chest 2 View  10/10/2012  *RADIOLOGY REPORT*  Clinical Data: 62 year old male chest pain.  CHEST - 2 VIEW  Comparison: 06/11/2008.  Findings: Larger lung volumes.  Cardiac size and mediastinal contours are within normal limits.  Visualized tracheal air column is within normal limits.  No pneumothorax or pulmonary edema.  No pleural effusion or confluent pulmonary opacity. No acute osseous abnormality identified.  IMPRESSION: No acute cardiopulmonary abnormality.   Original Report  Authenticated By: Erskine Speed, M.D.      1. Chest wall pain       MDM  Pt presenting with chest pain- pain is associated with certain movements and palpation.  No exertional component.  He has normal EKG, normal CXR and 2 sets of negative troponins. Also negative d-dimer. He has no pain in the ED.  On exam pain is reproducible and hx is most c/w musculoskeletal chest pain.  Also by report he has had negative stress test in the last 2 years.  He does have some risk factors for CAD, so 2 sets of troponin obtained and both negative.  Discharged with strict return precautions.  Pt agreeable with plan.  Advised close followup with his PMD and may need another stress test as an outpatient.        Ethelda Chick, MD 10/10/12 [phone]

## 2012-10-10 NOTE — ED Notes (Signed)
Pt states past 2 days has had intermittent L sided chest soreness, pt states worse when bending over, denies SOB/dizziness/numbness/tingling or n/v. Pt denies any other pain.

## 2013-08-30 DIAGNOSIS — E7800 Pure hypercholesterolemia, unspecified: Secondary | ICD-10-CM | POA: Diagnosis not present

## 2013-08-30 DIAGNOSIS — R635 Abnormal weight gain: Secondary | ICD-10-CM | POA: Diagnosis not present

## 2013-08-30 DIAGNOSIS — IMO0001 Reserved for inherently not codable concepts without codable children: Secondary | ICD-10-CM | POA: Diagnosis not present

## 2013-08-30 DIAGNOSIS — I10 Essential (primary) hypertension: Secondary | ICD-10-CM | POA: Diagnosis not present

## 2013-08-30 DIAGNOSIS — Z23 Encounter for immunization: Secondary | ICD-10-CM | POA: Diagnosis not present

## 2014-05-07 DIAGNOSIS — M109 Gout, unspecified: Secondary | ICD-10-CM | POA: Diagnosis not present

## 2014-05-07 DIAGNOSIS — Z23 Encounter for immunization: Secondary | ICD-10-CM | POA: Diagnosis not present

## 2014-08-16 DIAGNOSIS — E780 Pure hypercholesterolemia: Secondary | ICD-10-CM | POA: Diagnosis not present

## 2014-08-16 DIAGNOSIS — E119 Type 2 diabetes mellitus without complications: Secondary | ICD-10-CM | POA: Diagnosis not present

## 2014-08-16 DIAGNOSIS — M109 Gout, unspecified: Secondary | ICD-10-CM | POA: Diagnosis not present

## 2014-08-16 DIAGNOSIS — Z0000 Encounter for general adult medical examination without abnormal findings: Secondary | ICD-10-CM | POA: Diagnosis not present

## 2014-08-16 DIAGNOSIS — Z1389 Encounter for screening for other disorder: Secondary | ICD-10-CM | POA: Diagnosis not present

## 2014-08-16 DIAGNOSIS — I10 Essential (primary) hypertension: Secondary | ICD-10-CM | POA: Diagnosis not present

## 2014-08-16 DIAGNOSIS — Z1211 Encounter for screening for malignant neoplasm of colon: Secondary | ICD-10-CM | POA: Diagnosis not present

## 2014-09-04 DIAGNOSIS — Z1211 Encounter for screening for malignant neoplasm of colon: Secondary | ICD-10-CM | POA: Diagnosis not present

## 2014-09-14 DIAGNOSIS — E860 Dehydration: Secondary | ICD-10-CM | POA: Diagnosis not present

## 2015-02-18 DIAGNOSIS — M109 Gout, unspecified: Secondary | ICD-10-CM | POA: Diagnosis not present

## 2015-02-18 DIAGNOSIS — E780 Pure hypercholesterolemia: Secondary | ICD-10-CM | POA: Diagnosis not present

## 2015-02-18 DIAGNOSIS — I10 Essential (primary) hypertension: Secondary | ICD-10-CM | POA: Diagnosis not present

## 2015-02-18 DIAGNOSIS — E119 Type 2 diabetes mellitus without complications: Secondary | ICD-10-CM | POA: Diagnosis not present

## 2015-02-18 DIAGNOSIS — Z23 Encounter for immunization: Secondary | ICD-10-CM | POA: Diagnosis not present

## 2015-03-21 DIAGNOSIS — N189 Chronic kidney disease, unspecified: Secondary | ICD-10-CM | POA: Diagnosis not present

## 2015-06-08 DIAGNOSIS — M109 Gout, unspecified: Secondary | ICD-10-CM | POA: Diagnosis not present

## 2015-06-09 DIAGNOSIS — Z8739 Personal history of other diseases of the musculoskeletal system and connective tissue: Secondary | ICD-10-CM

## 2015-06-09 HISTORY — DX: Personal history of other diseases of the musculoskeletal system and connective tissue: Z87.39

## 2015-08-22 DIAGNOSIS — E7800 Pure hypercholesterolemia, unspecified: Secondary | ICD-10-CM | POA: Diagnosis not present

## 2015-08-22 DIAGNOSIS — Z0000 Encounter for general adult medical examination without abnormal findings: Secondary | ICD-10-CM | POA: Diagnosis not present

## 2015-08-22 DIAGNOSIS — M109 Gout, unspecified: Secondary | ICD-10-CM | POA: Diagnosis not present

## 2015-08-22 DIAGNOSIS — Z125 Encounter for screening for malignant neoplasm of prostate: Secondary | ICD-10-CM | POA: Diagnosis not present

## 2015-08-22 DIAGNOSIS — I10 Essential (primary) hypertension: Secondary | ICD-10-CM | POA: Diagnosis not present

## 2015-08-22 DIAGNOSIS — Z7984 Long term (current) use of oral hypoglycemic drugs: Secondary | ICD-10-CM | POA: Diagnosis not present

## 2015-08-22 DIAGNOSIS — Z1389 Encounter for screening for other disorder: Secondary | ICD-10-CM | POA: Diagnosis not present

## 2015-08-22 DIAGNOSIS — Z1211 Encounter for screening for malignant neoplasm of colon: Secondary | ICD-10-CM | POA: Diagnosis not present

## 2015-08-22 DIAGNOSIS — Z23 Encounter for immunization: Secondary | ICD-10-CM | POA: Diagnosis not present

## 2015-08-22 DIAGNOSIS — E119 Type 2 diabetes mellitus without complications: Secondary | ICD-10-CM | POA: Diagnosis not present

## 2015-10-15 DIAGNOSIS — Z1211 Encounter for screening for malignant neoplasm of colon: Secondary | ICD-10-CM | POA: Diagnosis not present

## 2015-10-28 ENCOUNTER — Other Ambulatory Visit: Payer: Self-pay | Admitting: Physician Assistant

## 2015-10-28 ENCOUNTER — Emergency Department (HOSPITAL_COMMUNITY)
Admission: EM | Admit: 2015-10-28 | Discharge: 2015-10-28 | Disposition: A | Discharge status: Home / Self Care | Payer: Medicare Other | Attending: Emergency Medicine | Admitting: Emergency Medicine

## 2015-10-28 ENCOUNTER — Emergency Department (HOSPITAL_COMMUNITY): Payer: Medicare Other

## 2015-10-28 ENCOUNTER — Encounter (HOSPITAL_COMMUNITY): Payer: Self-pay | Admitting: Emergency Medicine

## 2015-10-28 DIAGNOSIS — Z7984 Long term (current) use of oral hypoglycemic drugs: Secondary | ICD-10-CM | POA: Diagnosis not present

## 2015-10-28 DIAGNOSIS — R0789 Other chest pain: Secondary | ICD-10-CM

## 2015-10-28 DIAGNOSIS — E119 Type 2 diabetes mellitus without complications: Secondary | ICD-10-CM | POA: Diagnosis not present

## 2015-10-28 DIAGNOSIS — Z79899 Other long term (current) drug therapy: Secondary | ICD-10-CM | POA: Diagnosis not present

## 2015-10-28 DIAGNOSIS — I10 Essential (primary) hypertension: Secondary | ICD-10-CM | POA: Insufficient documentation

## 2015-10-28 DIAGNOSIS — Z791 Long term (current) use of non-steroidal anti-inflammatories (NSAID): Secondary | ICD-10-CM | POA: Diagnosis not present

## 2015-10-28 DIAGNOSIS — R079 Chest pain, unspecified: Secondary | ICD-10-CM | POA: Diagnosis not present

## 2015-10-28 HISTORY — DX: Hyperlipidemia, unspecified: E78.5

## 2015-10-28 LAB — BASIC METABOLIC PANEL
Anion gap: 8 (ref 5–15)
BUN: 21 mg/dL — AB (ref 6–20)
CALCIUM: 9.8 mg/dL (ref 8.9–10.3)
CO2: 26 mmol/L (ref 22–32)
Chloride: 105 mmol/L (ref 101–111)
Creatinine, Ser: 1.58 mg/dL — ABNORMAL HIGH (ref 0.61–1.24)
GFR calc Af Amer: 51 mL/min — ABNORMAL LOW (ref >60)
GFR, EST NON AFRICAN AMERICAN: 44 mL/min — AB (ref >60)
GLUCOSE: 113 mg/dL — AB (ref 65–99)
Potassium: 4.1 mmol/L (ref 3.5–5.1)
Sodium: 139 mmol/L (ref 135–145)

## 2015-10-28 LAB — CBC
HCT: 45.2 % (ref 39.0–52.0)
Hemoglobin: 14.4 g/dL (ref 13.0–17.0)
MCH: 27.1 pg (ref 26.0–34.0)
MCHC: 31.9 g/dL (ref 30.0–36.0)
MCV: 85 fL (ref 78.0–100.0)
Platelets: 133 10*3/uL — ABNORMAL LOW (ref 150–400)
RBC: 5.32 MIL/uL (ref 4.22–5.81)
RDW: 13.6 % (ref 11.5–15.5)
WBC: 7.6 10*3/uL (ref 4.0–10.5)

## 2015-10-28 LAB — I-STAT TROPONIN, ED
TROPONIN I, POC: 0 ng/mL (ref 0.00–0.08)
Troponin i, poc: 0 ng/mL (ref 0.00–0.08)

## 2015-10-28 IMAGING — CR DG CHEST 2V
2 series · 2 of 2 positions shown · non-contrast
Comparison: [DATE]

CLINICAL DATA: Chest pain

EXAM:
CHEST  2 VIEW

[chest pa]
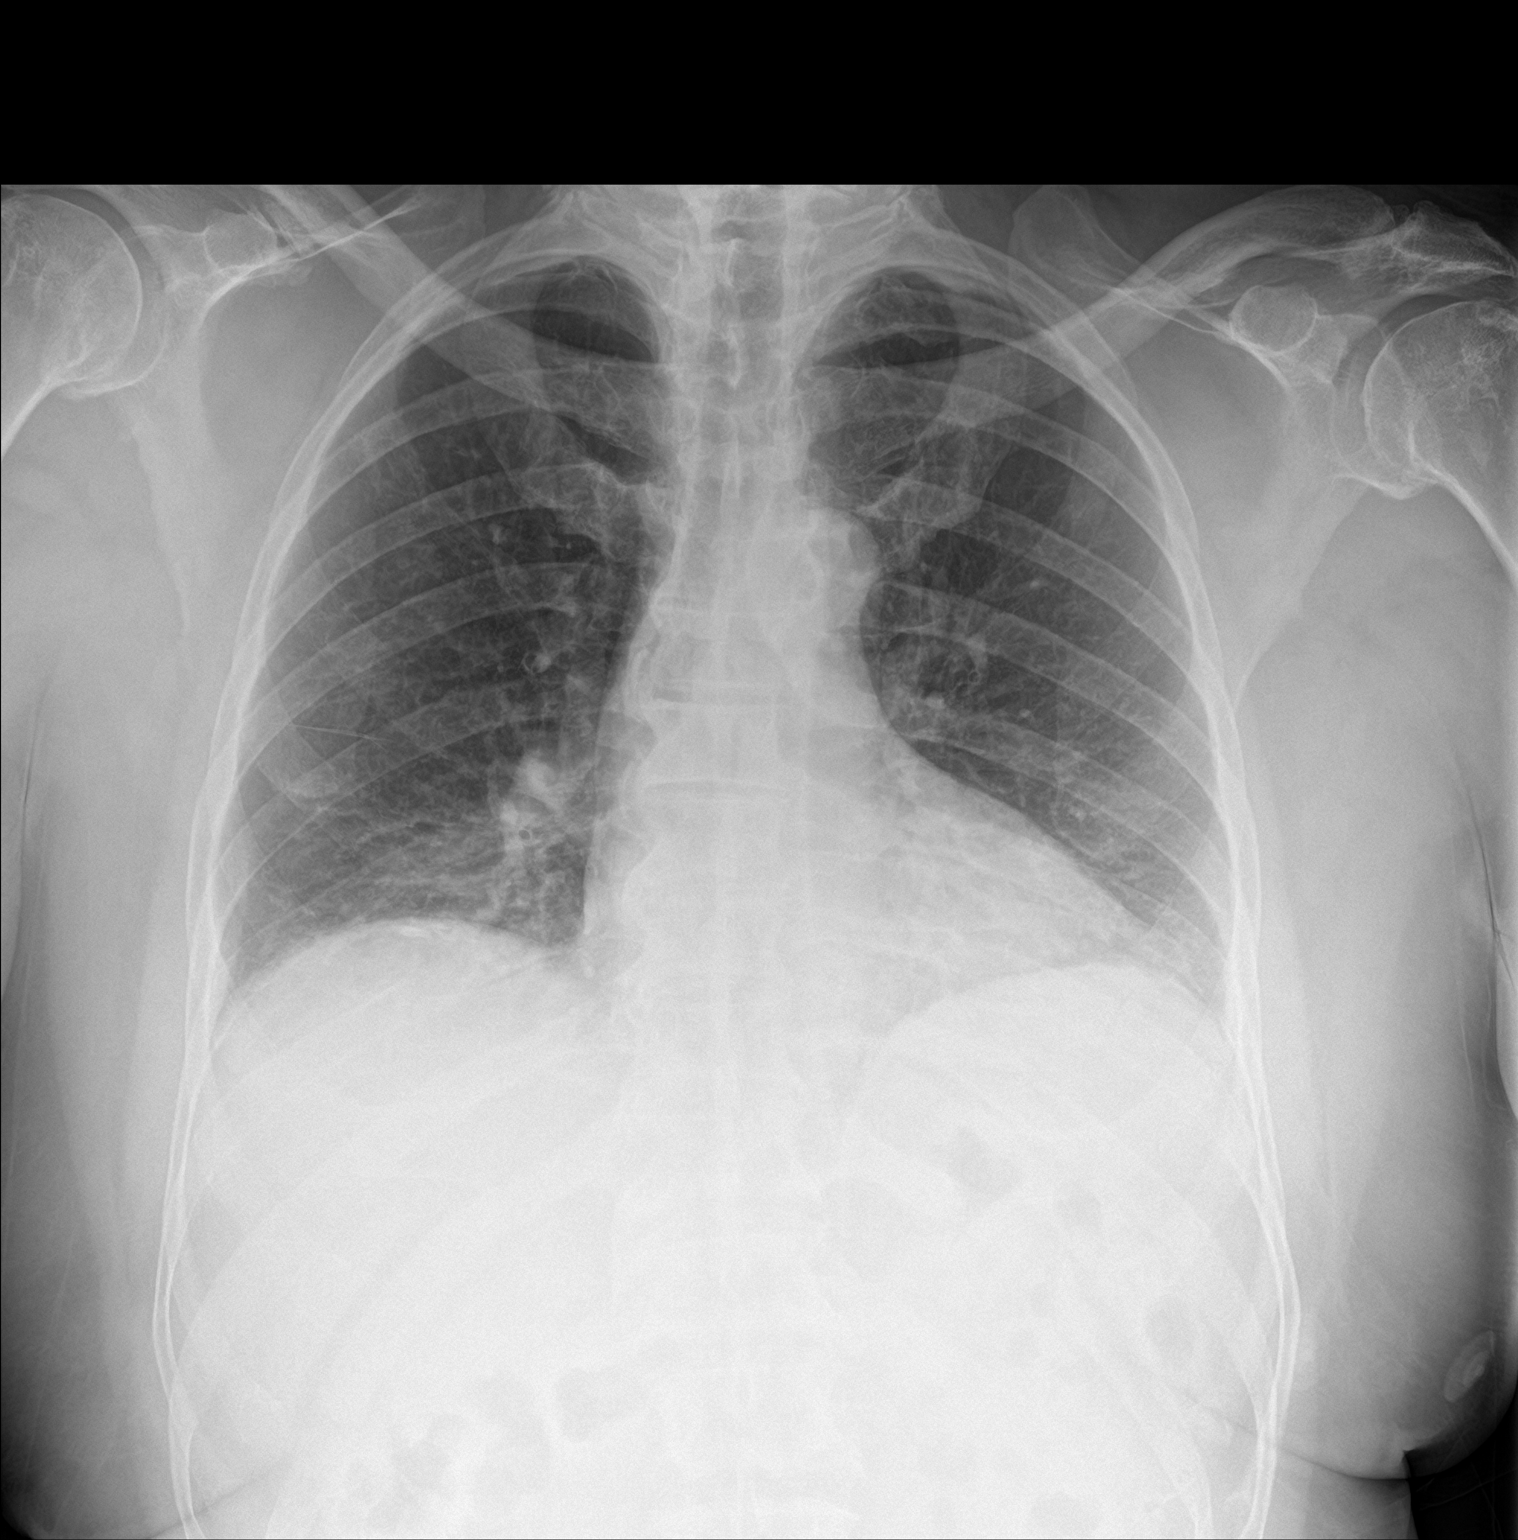

[chest lat]
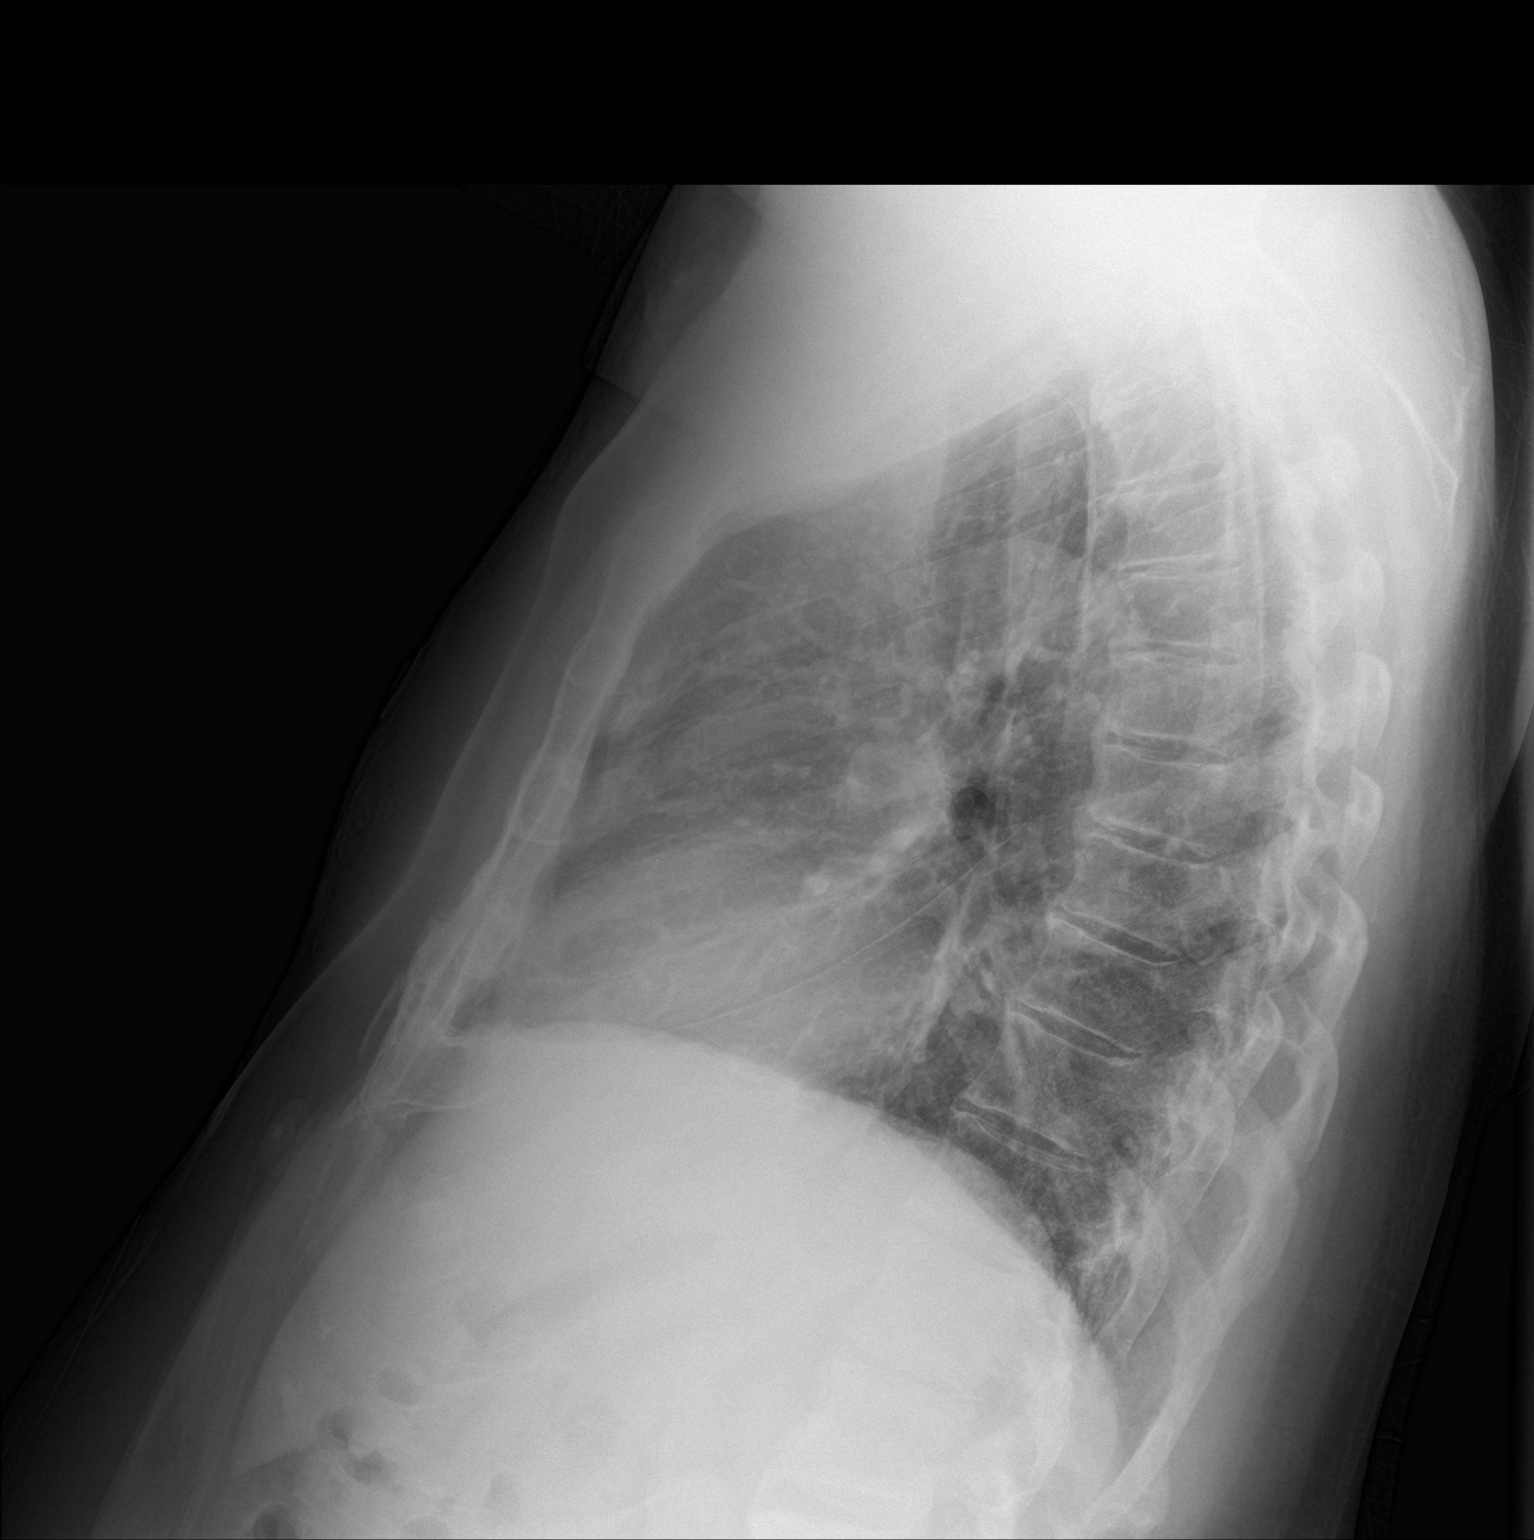

[2 of 2 positions shown; findings below may reference images not displayed]

FINDINGS: Hypoventilation with mild bibasilar atelectasis.

Negative for heart failure or edema. No pleural effusion. Negative
for mass lesion.
IMPRESSION: Hypoventilation with mild bibasilar atelectasis.

## 2015-10-28 MED ORDER — ASPIRIN 81 MG PO CHEW: 324.0000 mg | CHEWABLE_TABLET | Freq: Once | ORAL | Status: DC

## 2015-10-28 MED ORDER — MELOXICAM 7.5 MG PO TABS: 15.0000 mg | ORAL_TABLET | Freq: Every day | ORAL | Status: DC

## 2015-10-28 MED ORDER — KETOROLAC TROMETHAMINE 30 MG/ML IJ SOLN
30.0000 mg | Freq: Once | INTRAMUSCULAR | Status: AC
  Administered 2015-10-28: 30 mg via INTRAVENOUS
  Filled 2015-10-28: qty 1

## 2015-10-28 MED ORDER — NITROGLYCERIN 0.4 MG SL SUBL
0.4000 mg | SUBLINGUAL_TABLET | Freq: Once | SUBLINGUAL | Status: AC
  Administered 2015-10-28: 0.4 mg via SUBLINGUAL
  Filled 2015-10-28: qty 1

## 2015-10-28 MED ORDER — SODIUM CHLORIDE 0.9 % IV BOLUS (SEPSIS)
1000.0000 mL | Freq: Once | INTRAVENOUS | Status: AC
Start: 2015-10-28 — End: 2015-10-28
  Administered 2015-10-28: 1000 mL via INTRAVENOUS

## 2015-10-28 NOTE — Discharge Instructions (Signed)

## 2015-10-28 NOTE — ED Notes (Signed)
Pt verbalized understanding of d/c instructions, prescriptions, and follow-up care. No further questions/concerns, VSS, ambulatory w/ steady gait (refused wheelchair) 

## 2015-10-28 NOTE — ED Provider Notes (Signed)
CSN: [id number]     Arrival date & time 10/28/15  1050 History   First MD Initiated Contact with Patient 10/28/15 1344     Chief Complaint  Patient presents with  . Chest Pain     (Consider location/radiation/quality/duration/timing/severity/associated sxs/prior Treatment) Patient is a 65 y.o. male presenting with chest pain. The history is provided by the patient (Patient complains of left-sided chest pain since last night.).  Chest Pain Pain location:  L chest Pain quality: aching   Pain radiates to:  Does not radiate Pain radiates to the back: no   Pain severity:  Moderate Onset quality:  Sudden Associated symptoms: no abdominal pain, no back pain, no cough, no fatigue and no headache     Past Medical History  Diagnosis Date  . Hypertension   . Diabetes mellitus without complication ([place])    History reviewed. No pertinent past surgical history. No family history on file. Social History  Substance Use Topics  . Smoking status: Never Smoker   . Smokeless tobacco: Never Used  . Alcohol Use: No    Review of Systems  Constitutional: Negative for appetite change and fatigue.  HENT: Negative for congestion, ear discharge and sinus pressure.   Eyes: Negative for discharge.  Respiratory: Negative for cough.   Cardiovascular: Positive for chest pain.  Gastrointestinal: Negative for abdominal pain and diarrhea.  Genitourinary: Negative for frequency and hematuria.  Musculoskeletal: Negative for back pain.  Skin: Negative for rash.  Neurological: Negative for seizures and headaches.  Psychiatric/Behavioral: Negative for hallucinations.      Allergies  Review of patient's allergies indicates no known allergies.  Home Medications   Prior to Admission medications   Medication Sig Start Date End Date Taking? Authorizing Provider  lisinopril (PRINIVIL,ZESTRIL) 20 MG tablet Take 20 mg by mouth daily.    Historical Provider, MD  metFORMIN (GLUCOPHAGE) 1000 MG tablet Take  1,000 mg by mouth 2 (two) times daily with a meal.    Historical Provider, MD  naproxen sodium (ANAPROX) 550 MG tablet Take 550 mg by mouth 2 (two) times daily with a meal.    Historical Provider, MD  saxagliptin HCl (ONGLYZA) 5 MG TABS tablet Take 5 mg by mouth daily.    Historical Provider, MD  simvastatin (ZOCOR) 40 MG tablet Take 40 mg by mouth every evening.    Historical Provider, MD   BP 136/90 mmHg  Pulse 101  Temp(Src) 98.1 F (36.7 C) (Oral)  Resp 14  SpO2 97% Physical Exam  ED Course  Procedures (including critical care time) Labs Review Labs Reviewed  BASIC METABOLIC PANEL - Abnormal; Notable for the following:    Glucose, Bld 113 (*)    BUN 21 (*)    Creatinine, Ser 1.58 (*)    GFR calc non Af Amer 44 (*)    GFR calc Af Amer 51 (*)    All other components within normal limits  CBC - Abnormal; Notable for the following:    Platelets 133 (*)    All other components within normal limits  I-STAT TROPOININ, ED  Randolm Idol, ED    Imaging Review Dg Chest 2 View  10/28/2015  CLINICAL DATA:  Chest pain EXAM: CHEST  2 VIEW COMPARISON:  10/10/2012 FINDINGS: Hypoventilation with mild bibasilar atelectasis. Negative for heart failure or edema. No pleural effusion. Negative for mass lesion. IMPRESSION: Hypoventilation with mild bibasilar atelectasis. Electronically Signed   By: Franchot Gallo M.D.   On: 10/28/2015 11:19   I have personally reviewed  and evaluated these images and lab results as part of my medical decision-making.   EKG Interpretation   Date/Time:  Monday Oct 28 2015 10:55:56 EDT Ventricular Rate:  102 PR Interval:  128 QRS Duration: 94 QT Interval:  356 QTC Calculation: 463 R Axis:   68 Text Interpretation:  Sinus tachycardia Otherwise normal ECG Confirmed by  Kendrick Remigio  MD, Hillary Schwegler ([id number]) on 10/28/2015 1:48:15 PM      MDM   Final diagnoses:  None    Patient with diabetes and hypertension. Patient with chest pain since yesterday. Labs and EKG  unremarkable. Cardiology to consult    Milton Ferguson, MD 10/28/15 1500

## 2015-10-28 NOTE — ED Provider Notes (Signed)
Pt seen by Dr. Roderic Palau - Cardiolgoy - Dr. Debara Pickett has seen pt and states pt can go home and f/u outpt for stress - pt in agreement - fluids given for slight bump in Cr.    Noemi Chapel, MD 10/28/15 [phone]

## 2015-10-28 NOTE — ED Notes (Signed)
Pt ambulated to room from waiting room, tolerated well. 

## 2015-10-28 NOTE — ED Notes (Signed)
Cardiology at bedside.

## 2015-10-28 NOTE — ED Notes (Signed)
Pt reports left sided CP starting yesterday. Pt denies any other symptoms. Pt alert x4.

## 2015-10-28 NOTE — Consult Note (Signed)
CARDIOLOGY CONSULT NOTE   Patient ID: Garrett Snyder MRN: [id number], DOB/AGE: December 19, 1950   Admit date: 10/28/2015 Date of Consult: 10/28/2015   Primary Physician: No primary care provider on file. Primary Cardiologist: new  Pt. Profile  Mr. Garrett Snyder is a 65 year old African-American male with past medical history of hypertension, hyperlipidemia, and diabetes presented with persistent CP that woke him up from sleep  Problem List  Past Medical History  Diagnosis Date  . Hypertension   . Diabetes mellitus without complication ([place])   . Hyperlipidemia     Past Surgical History  Procedure Laterality Date  . No past surgeries       Allergies  No Known Allergies  HPI   Mr. Garrett Snyder is a 65 year old African-American male with past medical history of hypertension, hyperlipidemia, and diabetes. He did have an episode of chest pain several years ago and underwent a stress test around 2012 that came back negative. He has not seen a cardiologist since. Otherwise he does not smoke and has no significant family history of CAD, although he is not sure about his family history. He denies any recent exertional symptoms, in fact, he was carrying a heavy load of refrigerated ribs Saturday focal. He had no chest pain during the heavy lifting. He woke up around 1 AM this morning feeling some left-sided chest discomfort/LUQ discomfort. It is not worse with walking around, deep inspiration, body rotation or palpation. Nothing seems to exacerbate it or alleviate it. It has been constant since that time and has been ongoing for the past 13 hours. He denies any shortness breath or diaphoresis or dizziness. He eventually sought medical [facility] at [facility] ED.  On arrival, his blood pressure was stable. Heart rate is mildly elevated in the 90s to 100 range. He is afebrile. Significant lab through finding include creatinine of 1.58, whereas his baseline creatinine was about 1.0. Chest x-ray showed no acute  process. EKG showed sinus tachycardia with nonspecific ST-T wave changes. So far he has had 2 negative troponin despite prolonged episode of chest discomfort. Cardiology has been consulted for atypical chest pain.   Inpatient Medications  . aspirin  324 mg Oral Once    Family History Family History  Problem Relation Age of Onset  . Family history unknown: Yes     Social History Social History   Social History  . Marital Status: Married    Spouse Name: N/A  . Number of Children: N/A  . Years of Education: N/A   Occupational History  . Not on file.   Social History Main Topics  . Smoking status: Never Smoker   . Smokeless tobacco: Never Used  . Alcohol Use: No  . Drug Use: No  . Sexual Activity: Not on file   Other Topics Concern  . Not on file   Social History Narrative     Review of Systems  General:  No chills, fever, night sweats or weight changes.  Cardiovascular:  No dyspnea on exertion, edema, orthopnea, palpitations, paroxysmal nocturnal dyspnea. +chest pain Dermatological: No rash, lesions/masses Respiratory: No cough, dyspnea Urologic: No hematuria, dysuria Abdominal:   No nausea, vomiting, diarrhea, bright red blood per rectum, melena, or hematemesis Neurologic:  No visual changes, wkns, changes in mental status. All other systems reviewed and are otherwise negative except as noted above.  Physical Exam  Blood pressure 120/80, pulse 97, temperature 98.1 F (36.7 C), temperature source Oral, resp. rate 20, SpO2 98 %.  General: Pleasant, NAD Psych: Normal affect.  Neuro: Alert and oriented X 3. Moves all extremities spontaneously. HEENT: Normal  Neck: Supple without bruits or JVD. Lungs:  Resp regular and unlabored, CTA. Heart: RRR no s3, s4, or murmurs. Abdomen: Soft, non-tender, non-distended, BS + x 4.  Extremities: No clubbing, cyanosis or edema. DP/PT/Radials 2+ and equal bilaterally.  Labs  No results for input(s): CKTOTAL, CKMB, TROPONINI  in the last 72 hours. Lab Results  Component Value Date   WBC 7.6 10/28/2015   HGB 14.4 10/28/2015   HCT 45.2 10/28/2015   MCV 85.0 10/28/2015   PLT 133* 10/28/2015     Recent Labs Lab 10/28/15 1109  NA 139  K 4.1  CL 105  CO2 26  BUN 21*  CREATININE 1.58*  CALCIUM 9.8  GLUCOSE 113*   Lab Results  Component Value Date   CHOL  06/12/2008    126        ATP III CLASSIFICATION:  <200     mg/dL   Desirable  200-239  mg/dL   Borderline High  >=240    mg/dL   High          HDL 24* 06/12/2008   LDLCALC  06/12/2008    77        Total Cholesterol/HDL:CHD Risk Coronary Heart Disease Risk Table                     Men   Women  1/2 Average Risk   3.4   3.3  Average Risk       5.0   4.4  2 X Average Risk   9.6   7.1  3 X Average Risk  23.4   11.0        Use the calculated Patient Ratio above and the CHD Risk Table to determine the patient's CHD Risk.        ATP III CLASSIFICATION (LDL):  <100     mg/dL   Optimal  100-129  mg/dL   Near or Above                    Optimal  130-159  mg/dL   Borderline  160-189  mg/dL   High  >190     mg/dL   Very High   TRIG 124 06/12/2008   Lab Results  Component Value Date   DDIMER <0.27 10/10/2012    Radiology/Studies  Dg Chest 2 View  10/28/2015  CLINICAL DATA:  Chest pain EXAM: CHEST  2 VIEW COMPARISON:  10/10/2012 FINDINGS: Hypoventilation with mild bibasilar atelectasis. Negative for heart failure or edema. No pleural effusion. Negative for mass lesion. IMPRESSION: Hypoventilation with mild bibasilar atelectasis. Electronically Signed   By: Franchot Gallo M.D.   On: 10/28/2015 11:19    ECG  Normal sinus rhythm with nonspecific ST-T wave changes  ASSESSMENT AND PLAN  1. Atypical chest pain:  - Risk factors include hypertension, hyperlipidemia and diabetes and age. However prolonged episode with negative troponin change and EKG change make it very atypical. Does not seems to have obvious alleviating or exacerbating  factors.   - Will discuss with M.D. regarding inpatient versus outpatient Myoview.  2. Hypertension  3. Hyperlipidemia  4. Diabetes mellitus   Signed, Almyra Deforest, PA-C 10/28/2015, 3:44 PM

## 2015-11-01 ENCOUNTER — Encounter: Payer: Self-pay | Admitting: Internal Medicine

## 2015-12-26 ENCOUNTER — Telehealth (HOSPITAL_COMMUNITY): Payer: Self-pay | Admitting: *Deleted

## 2015-12-26 NOTE — Telephone Encounter (Signed)
Patient given detailed instructions per Myocardial Perfusion Study Information Sheet for the test on 12/31/15. Patient notified to arrive 15 minutes early and that it is imperative to arrive on time for appointment to keep from having the test rescheduled.  If you need to cancel or reschedule your appointment, please call the office within 24 hours of your appointment. Failure to do so may result in a cancellation of your appointment, and a $50 no show fee. Patient verbalized understanding. Monterrio Gerst J Samarra Ridgely, RN   

## 2015-12-27 ENCOUNTER — Encounter (HOSPITAL_COMMUNITY): Payer: Medicare Other

## 2015-12-30 ENCOUNTER — Encounter (HOSPITAL_COMMUNITY): Payer: Medicare Other

## 2015-12-31 ENCOUNTER — Ambulatory Visit (HOSPITAL_COMMUNITY): Payer: Medicare Other | Attending: Cardiology

## 2015-12-31 DIAGNOSIS — E119 Type 2 diabetes mellitus without complications: Secondary | ICD-10-CM | POA: Insufficient documentation

## 2015-12-31 DIAGNOSIS — R0789 Other chest pain: Secondary | ICD-10-CM | POA: Diagnosis not present

## 2015-12-31 DIAGNOSIS — I10 Essential (primary) hypertension: Secondary | ICD-10-CM | POA: Insufficient documentation

## 2015-12-31 LAB — MYOCARDIAL PERFUSION IMAGING
CHL CUP NUCLEAR SSS: 7
CHL CUP RESTING HR STRESS: 86 {beats}/min
LHR: 0.25
LVDIAVOL: 69 mL (ref 62–150)
LVSYSVOL: 22 mL
NUC STRESS TID: 1.4
Peak HR: 110 {beats}/min
SDS: 0
SRS: 7

## 2015-12-31 IMAGING — NM NM MISC PROCEDURE
9 series · 54 of 54 positions shown · non-contrast
Comparison: none

[Series 1: wbr_r-proj_st rest_(id)_sa · 6.5mm · 6.51mm/px · 6 of 64 frames shown]
[frame 6/64]
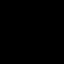
[frame 16/64]
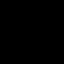
[frame 27/64]
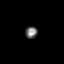
[frame 38/64]
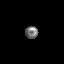
[frame 48/64]
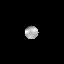
[frame 59/64]
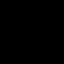

[Series 1: wbr_s-proj_st stress_(id)_sa · 6.5mm · 6.51mm/px · 6 of 512 frames shown (1 of 2)]
[frame 43/512]
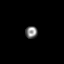
[frame 128/512]
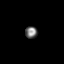
[frame 214/512]
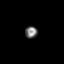
[frame 299/512]
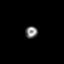
[frame 384/512]
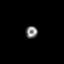
[frame 470/512]
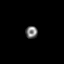

[Series 1: wbr_s-proj_st stress_(id)_sa · 6.5mm · 6.51mm/px · 6 of 64 frames shown (2 of 2)]
[frame 6/64]
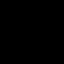
[frame 16/64]
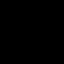
[frame 27/64]
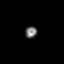
[frame 38/64]
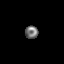
[frame 48/64]
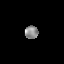
[frame 59/64]
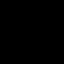

[Series 1: wbr_r-proj_st rest · 6.51mm/px · 6 of 64 frames shown]
[frame 6/64]
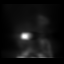
[frame 16/64]
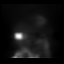
[frame 27/64]
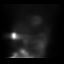
[frame 38/64]
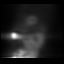
[frame 48/64]
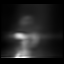
[frame 59/64]
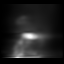

[Series 1: rest · 6.51mm/px · 6 of 64 frames shown]
[frame 6/64]
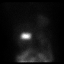
[frame 16/64]
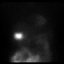
[frame 27/64]
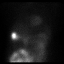
[frame 38/64]
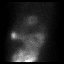
[frame 48/64]
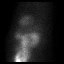
[frame 59/64]
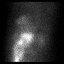

[Series 2: wbr_s-proj_st stress · 6.51mm/px · 6 of 512 frames shown (1 of 2)]
[frame 43/512]
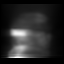
[frame 128/512]
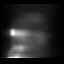
[frame 214/512]
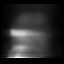
[frame 299/512]
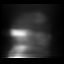
[frame 384/512]
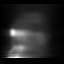
[frame 470/512]
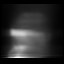

[Series 2: stress · 6.51mm/px · 6 of 64 frames shown (1 of 2)]
[frame 6/64]
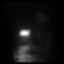
[frame 16/64]
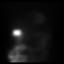
[frame 27/64]
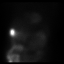
[frame 38/64]
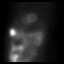
[frame 48/64]
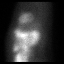
[frame 59/64]
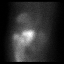

[Series 2: stress · 6.51mm/px · 6 of 512 frames shown (2 of 2)]
[frame 43/512]
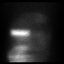
[frame 128/512]
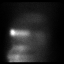
[frame 214/512]
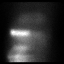
[frame 299/512]
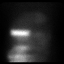
[frame 384/512]
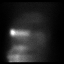
[frame 470/512]
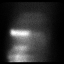

[Series 2: wbr_s-proj_st stress · 6.51mm/px · 6 of 64 frames shown (2 of 2)]
[frame 6/64]
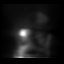
[frame 16/64]
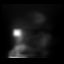
[frame 27/64]
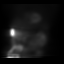
[frame 38/64]
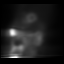
[frame 48/64]
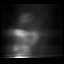
[frame 59/64]
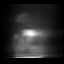

[54 of 54 positions shown; findings below may reference images not displayed]

Canned report from images found in remote index.

Refer to host system for actual result text.

## 2015-12-31 MED ORDER — TECHNETIUM TC 99M TETROFOSMIN IV KIT
10.2000 | PACK | Freq: Once | INTRAVENOUS | Status: AC | PRN
  Administered 2015-12-31: 10 via INTRAVENOUS
  Filled 2015-12-31: qty 10

## 2015-12-31 MED ORDER — REGADENOSON 0.4 MG/5ML IV SOLN
0.4000 mg | Freq: Once | INTRAVENOUS | Status: AC
  Administered 2015-12-31: 0.4 mg via INTRAVENOUS

## 2015-12-31 MED ORDER — TECHNETIUM TC 99M TETROFOSMIN IV KIT
32.7000 | PACK | Freq: Once | INTRAVENOUS | Status: AC | PRN
  Administered 2015-12-31: 32.7 via INTRAVENOUS
  Filled 2015-12-31: qty 33

## 2016-02-26 DIAGNOSIS — I129 Hypertensive chronic kidney disease with stage 1 through stage 4 chronic kidney disease, or unspecified chronic kidney disease: Secondary | ICD-10-CM | POA: Diagnosis not present

## 2016-02-26 DIAGNOSIS — D696 Thrombocytopenia, unspecified: Secondary | ICD-10-CM | POA: Diagnosis not present

## 2016-02-26 DIAGNOSIS — M109 Gout, unspecified: Secondary | ICD-10-CM | POA: Diagnosis not present

## 2016-02-26 DIAGNOSIS — N183 Chronic kidney disease, stage 3 (moderate): Secondary | ICD-10-CM | POA: Diagnosis not present

## 2016-02-26 DIAGNOSIS — Z23 Encounter for immunization: Secondary | ICD-10-CM | POA: Diagnosis not present

## 2016-02-26 DIAGNOSIS — E7800 Pure hypercholesterolemia, unspecified: Secondary | ICD-10-CM | POA: Diagnosis not present

## 2016-02-26 DIAGNOSIS — E1122 Type 2 diabetes mellitus with diabetic chronic kidney disease: Secondary | ICD-10-CM | POA: Diagnosis not present

## 2016-04-27 DIAGNOSIS — Z1211 Encounter for screening for malignant neoplasm of colon: Secondary | ICD-10-CM | POA: Diagnosis not present

## 2016-05-13 DIAGNOSIS — Z1211 Encounter for screening for malignant neoplasm of colon: Secondary | ICD-10-CM | POA: Diagnosis not present

## 2016-05-13 DIAGNOSIS — Z01818 Encounter for other preprocedural examination: Secondary | ICD-10-CM | POA: Diagnosis not present

## 2016-05-13 DIAGNOSIS — E119 Type 2 diabetes mellitus without complications: Secondary | ICD-10-CM | POA: Diagnosis not present

## 2016-06-08 DIAGNOSIS — C61 Malignant neoplasm of prostate: Secondary | ICD-10-CM

## 2016-06-08 HISTORY — DX: Malignant neoplasm of prostate: C61

## 2016-08-25 DIAGNOSIS — Z6831 Body mass index (BMI) 31.0-31.9, adult: Secondary | ICD-10-CM | POA: Diagnosis not present

## 2016-08-25 DIAGNOSIS — I129 Hypertensive chronic kidney disease with stage 1 through stage 4 chronic kidney disease, or unspecified chronic kidney disease: Secondary | ICD-10-CM | POA: Diagnosis not present

## 2016-08-25 DIAGNOSIS — E1122 Type 2 diabetes mellitus with diabetic chronic kidney disease: Secondary | ICD-10-CM | POA: Diagnosis not present

## 2016-08-25 DIAGNOSIS — Z7984 Long term (current) use of oral hypoglycemic drugs: Secondary | ICD-10-CM | POA: Diagnosis not present

## 2016-08-25 DIAGNOSIS — Z1211 Encounter for screening for malignant neoplasm of colon: Secondary | ICD-10-CM | POA: Diagnosis not present

## 2016-08-25 DIAGNOSIS — Z0000 Encounter for general adult medical examination without abnormal findings: Secondary | ICD-10-CM | POA: Diagnosis not present

## 2016-08-25 DIAGNOSIS — M109 Gout, unspecified: Secondary | ICD-10-CM | POA: Diagnosis not present

## 2016-08-25 DIAGNOSIS — N183 Chronic kidney disease, stage 3 (moderate): Secondary | ICD-10-CM | POA: Diagnosis not present

## 2016-08-25 DIAGNOSIS — D696 Thrombocytopenia, unspecified: Secondary | ICD-10-CM | POA: Diagnosis not present

## 2016-08-25 DIAGNOSIS — E7800 Pure hypercholesterolemia, unspecified: Secondary | ICD-10-CM | POA: Diagnosis not present

## 2016-08-31 DIAGNOSIS — I129 Hypertensive chronic kidney disease with stage 1 through stage 4 chronic kidney disease, or unspecified chronic kidney disease: Secondary | ICD-10-CM | POA: Diagnosis not present

## 2016-08-31 DIAGNOSIS — E7800 Pure hypercholesterolemia, unspecified: Secondary | ICD-10-CM | POA: Diagnosis not present

## 2016-08-31 DIAGNOSIS — Z125 Encounter for screening for malignant neoplasm of prostate: Secondary | ICD-10-CM | POA: Diagnosis not present

## 2016-08-31 DIAGNOSIS — Z0000 Encounter for general adult medical examination without abnormal findings: Secondary | ICD-10-CM | POA: Diagnosis not present

## 2016-09-11 DIAGNOSIS — R972 Elevated prostate specific antigen [PSA]: Secondary | ICD-10-CM | POA: Diagnosis not present

## 2016-09-11 DIAGNOSIS — M109 Gout, unspecified: Secondary | ICD-10-CM | POA: Diagnosis not present

## 2016-09-29 DIAGNOSIS — Z125 Encounter for screening for malignant neoplasm of prostate: Secondary | ICD-10-CM | POA: Diagnosis not present

## 2016-09-29 DIAGNOSIS — R972 Elevated prostate specific antigen [PSA]: Secondary | ICD-10-CM | POA: Diagnosis not present

## 2016-10-14 DIAGNOSIS — N400 Enlarged prostate without lower urinary tract symptoms: Secondary | ICD-10-CM | POA: Diagnosis not present

## 2016-10-14 DIAGNOSIS — R972 Elevated prostate specific antigen [PSA]: Secondary | ICD-10-CM | POA: Diagnosis not present

## 2016-10-26 DIAGNOSIS — R972 Elevated prostate specific antigen [PSA]: Secondary | ICD-10-CM | POA: Diagnosis not present

## 2016-10-26 DIAGNOSIS — C61 Malignant neoplasm of prostate: Secondary | ICD-10-CM | POA: Diagnosis not present

## 2016-11-03 DIAGNOSIS — C61 Malignant neoplasm of prostate: Secondary | ICD-10-CM | POA: Diagnosis not present

## 2016-12-21 DIAGNOSIS — E119 Type 2 diabetes mellitus without complications: Secondary | ICD-10-CM | POA: Diagnosis not present

## 2016-12-21 DIAGNOSIS — H35033 Hypertensive retinopathy, bilateral: Secondary | ICD-10-CM | POA: Diagnosis not present

## 2016-12-21 DIAGNOSIS — H401134 Primary open-angle glaucoma, bilateral, indeterminate stage: Secondary | ICD-10-CM | POA: Diagnosis not present

## 2016-12-21 DIAGNOSIS — H2513 Age-related nuclear cataract, bilateral: Secondary | ICD-10-CM | POA: Diagnosis not present

## 2017-01-11 DIAGNOSIS — H401114 Primary open-angle glaucoma, right eye, indeterminate stage: Secondary | ICD-10-CM | POA: Diagnosis not present

## 2017-01-26 DIAGNOSIS — H401124 Primary open-angle glaucoma, left eye, indeterminate stage: Secondary | ICD-10-CM | POA: Diagnosis not present

## 2017-02-15 DIAGNOSIS — D075 Carcinoma in situ of prostate: Secondary | ICD-10-CM | POA: Diagnosis not present

## 2017-02-15 DIAGNOSIS — C61 Malignant neoplasm of prostate: Secondary | ICD-10-CM | POA: Diagnosis not present

## 2017-02-22 DIAGNOSIS — C61 Malignant neoplasm of prostate: Secondary | ICD-10-CM | POA: Diagnosis not present

## 2017-03-15 DIAGNOSIS — H401111 Primary open-angle glaucoma, right eye, mild stage: Secondary | ICD-10-CM | POA: Diagnosis not present

## 2017-03-15 DIAGNOSIS — H401122 Primary open-angle glaucoma, left eye, moderate stage: Secondary | ICD-10-CM | POA: Diagnosis not present

## 2017-03-29 DIAGNOSIS — N183 Chronic kidney disease, stage 3 (moderate): Secondary | ICD-10-CM | POA: Diagnosis not present

## 2017-03-29 DIAGNOSIS — Z23 Encounter for immunization: Secondary | ICD-10-CM | POA: Diagnosis not present

## 2017-03-29 DIAGNOSIS — E1122 Type 2 diabetes mellitus with diabetic chronic kidney disease: Secondary | ICD-10-CM | POA: Diagnosis not present

## 2017-03-29 DIAGNOSIS — H6123 Impacted cerumen, bilateral: Secondary | ICD-10-CM | POA: Diagnosis not present

## 2017-03-29 DIAGNOSIS — Z7984 Long term (current) use of oral hypoglycemic drugs: Secondary | ICD-10-CM | POA: Diagnosis not present

## 2017-03-29 DIAGNOSIS — Z6830 Body mass index (BMI) 30.0-30.9, adult: Secondary | ICD-10-CM | POA: Diagnosis not present

## 2017-03-29 DIAGNOSIS — M109 Gout, unspecified: Secondary | ICD-10-CM | POA: Diagnosis not present

## 2017-03-29 DIAGNOSIS — D696 Thrombocytopenia, unspecified: Secondary | ICD-10-CM | POA: Diagnosis not present

## 2017-03-29 DIAGNOSIS — E7800 Pure hypercholesterolemia, unspecified: Secondary | ICD-10-CM | POA: Diagnosis not present

## 2017-03-29 DIAGNOSIS — C61 Malignant neoplasm of prostate: Secondary | ICD-10-CM | POA: Diagnosis not present

## 2017-03-29 DIAGNOSIS — I129 Hypertensive chronic kidney disease with stage 1 through stage 4 chronic kidney disease, or unspecified chronic kidney disease: Secondary | ICD-10-CM | POA: Diagnosis not present

## 2018-08-16 ENCOUNTER — Emergency Department (HOSPITAL_COMMUNITY): Payer: Medicare Other

## 2018-08-16 ENCOUNTER — Observation Stay (HOSPITAL_COMMUNITY)
Admission: EM | Admit: 2018-08-16 | Discharge: 2018-08-17 | Disposition: A | Discharge status: Home / Self Care | Payer: Medicare Other | Attending: Internal Medicine | Admitting: Internal Medicine

## 2018-08-16 ENCOUNTER — Encounter (HOSPITAL_COMMUNITY): Payer: Self-pay | Admitting: Emergency Medicine

## 2018-08-16 DIAGNOSIS — I129 Hypertensive chronic kidney disease with stage 1 through stage 4 chronic kidney disease, or unspecified chronic kidney disease: Secondary | ICD-10-CM | POA: Diagnosis not present

## 2018-08-16 DIAGNOSIS — D696 Thrombocytopenia, unspecified: Secondary | ICD-10-CM | POA: Diagnosis not present

## 2018-08-16 DIAGNOSIS — N183 Chronic kidney disease, stage 3 (moderate): Secondary | ICD-10-CM | POA: Diagnosis not present

## 2018-08-16 DIAGNOSIS — E1122 Type 2 diabetes mellitus with diabetic chronic kidney disease: Secondary | ICD-10-CM | POA: Diagnosis not present

## 2018-08-16 DIAGNOSIS — M109 Gout, unspecified: Secondary | ICD-10-CM | POA: Insufficient documentation

## 2018-08-16 DIAGNOSIS — R0789 Other chest pain: Secondary | ICD-10-CM | POA: Diagnosis present

## 2018-08-16 DIAGNOSIS — Z79899 Other long term (current) drug therapy: Secondary | ICD-10-CM | POA: Insufficient documentation

## 2018-08-16 DIAGNOSIS — E1169 Type 2 diabetes mellitus with other specified complication: Secondary | ICD-10-CM | POA: Diagnosis present

## 2018-08-16 DIAGNOSIS — I10 Essential (primary) hypertension: Secondary | ICD-10-CM | POA: Diagnosis present

## 2018-08-16 DIAGNOSIS — Z87898 Personal history of other specified conditions: Secondary | ICD-10-CM

## 2018-08-16 DIAGNOSIS — Z7984 Long term (current) use of oral hypoglycemic drugs: Secondary | ICD-10-CM | POA: Diagnosis not present

## 2018-08-16 DIAGNOSIS — E785 Hyperlipidemia, unspecified: Secondary | ICD-10-CM | POA: Insufficient documentation

## 2018-08-16 DIAGNOSIS — R072 Precordial pain: Principal | ICD-10-CM

## 2018-08-16 DIAGNOSIS — R079 Chest pain, unspecified: Secondary | ICD-10-CM | POA: Diagnosis present

## 2018-08-16 DIAGNOSIS — Z8739 Personal history of other diseases of the musculoskeletal system and connective tissue: Secondary | ICD-10-CM

## 2018-08-16 DIAGNOSIS — Z7982 Long term (current) use of aspirin: Secondary | ICD-10-CM | POA: Insufficient documentation

## 2018-08-16 HISTORY — DX: Personal history of other specified conditions: Z87.898

## 2018-08-16 HISTORY — DX: Thrombocytopenia, unspecified: D69.6

## 2018-08-16 LAB — BASIC METABOLIC PANEL
Anion gap: 9 (ref 5–15)
BUN: 18 mg/dL (ref 8–23)
CALCIUM: 9.7 mg/dL (ref 8.9–10.3)
CO2: 26 mmol/L (ref 22–32)
CREATININE: 1.52 mg/dL — AB (ref 0.61–1.24)
Chloride: 104 mmol/L (ref 98–111)
GFR calc Af Amer: 53 mL/min — ABNORMAL LOW (ref >60)
GFR calc non Af Amer: 46 mL/min — ABNORMAL LOW (ref >60)
GLUCOSE: 121 mg/dL — AB (ref 70–99)
Potassium: 4.4 mmol/L (ref 3.5–5.1)
Sodium: 139 mmol/L (ref 135–145)

## 2018-08-16 LAB — CBC WITH DIFFERENTIAL/PLATELET
Abs Immature Granulocytes: 0.03 10*3/uL (ref 0.00–0.07)
BASOS ABS: 0 10*3/uL (ref 0.0–0.1)
BASOS PCT: 1 %
Eosinophils Absolute: 0.1 10*3/uL (ref 0.0–0.5)
Eosinophils Relative: 1 %
HCT: 49.7 % (ref 39.0–52.0)
Hemoglobin: 15.1 g/dL (ref 13.0–17.0)
IMMATURE GRANULOCYTES: 0 %
Lymphocytes Relative: 18 %
Lymphs Abs: 1.5 10*3/uL (ref 0.7–4.0)
MCH: 26.7 pg (ref 26.0–34.0)
MCHC: 30.4 g/dL (ref 30.0–36.0)
MCV: 87.8 fL (ref 80.0–100.0)
Monocytes Absolute: 0.6 10*3/uL (ref 0.1–1.0)
Monocytes Relative: 6 %
Neutro Abs: 6.4 10*3/uL (ref 1.7–7.7)
Neutrophils Relative %: 74 %
PLATELETS: 137 10*3/uL — AB (ref 150–400)
RBC: 5.66 MIL/uL (ref 4.22–5.81)
RDW: 13.5 % (ref 11.5–15.5)
WBC: 8.6 10*3/uL (ref 4.0–10.5)
nRBC: 0 % (ref 0.0–0.2)

## 2018-08-16 LAB — GLUCOSE, CAPILLARY
Glucose-Capillary: 129 mg/dL — ABNORMAL HIGH (ref 70–99)
Glucose-Capillary: 135 mg/dL — ABNORMAL HIGH (ref 70–99)

## 2018-08-16 LAB — I-STAT TROPONIN, ED: TROPONIN I, POC: 0 ng/mL (ref 0.00–0.08)

## 2018-08-16 LAB — CBG MONITORING, ED: Glucose-Capillary: 123 mg/dL — ABNORMAL HIGH (ref 70–99)

## 2018-08-16 LAB — TROPONIN I: Troponin I: <0.03 ng/mL (ref <0.03)

## 2018-08-16 IMAGING — DX CHEST - 2 VIEW
2 series · 2 of 2 positions shown · non-contrast
Comparison: None.

CLINICAL DATA: Chest pain for 2 hours

EXAM:
CHEST - 2 VIEW

[w chest pa]
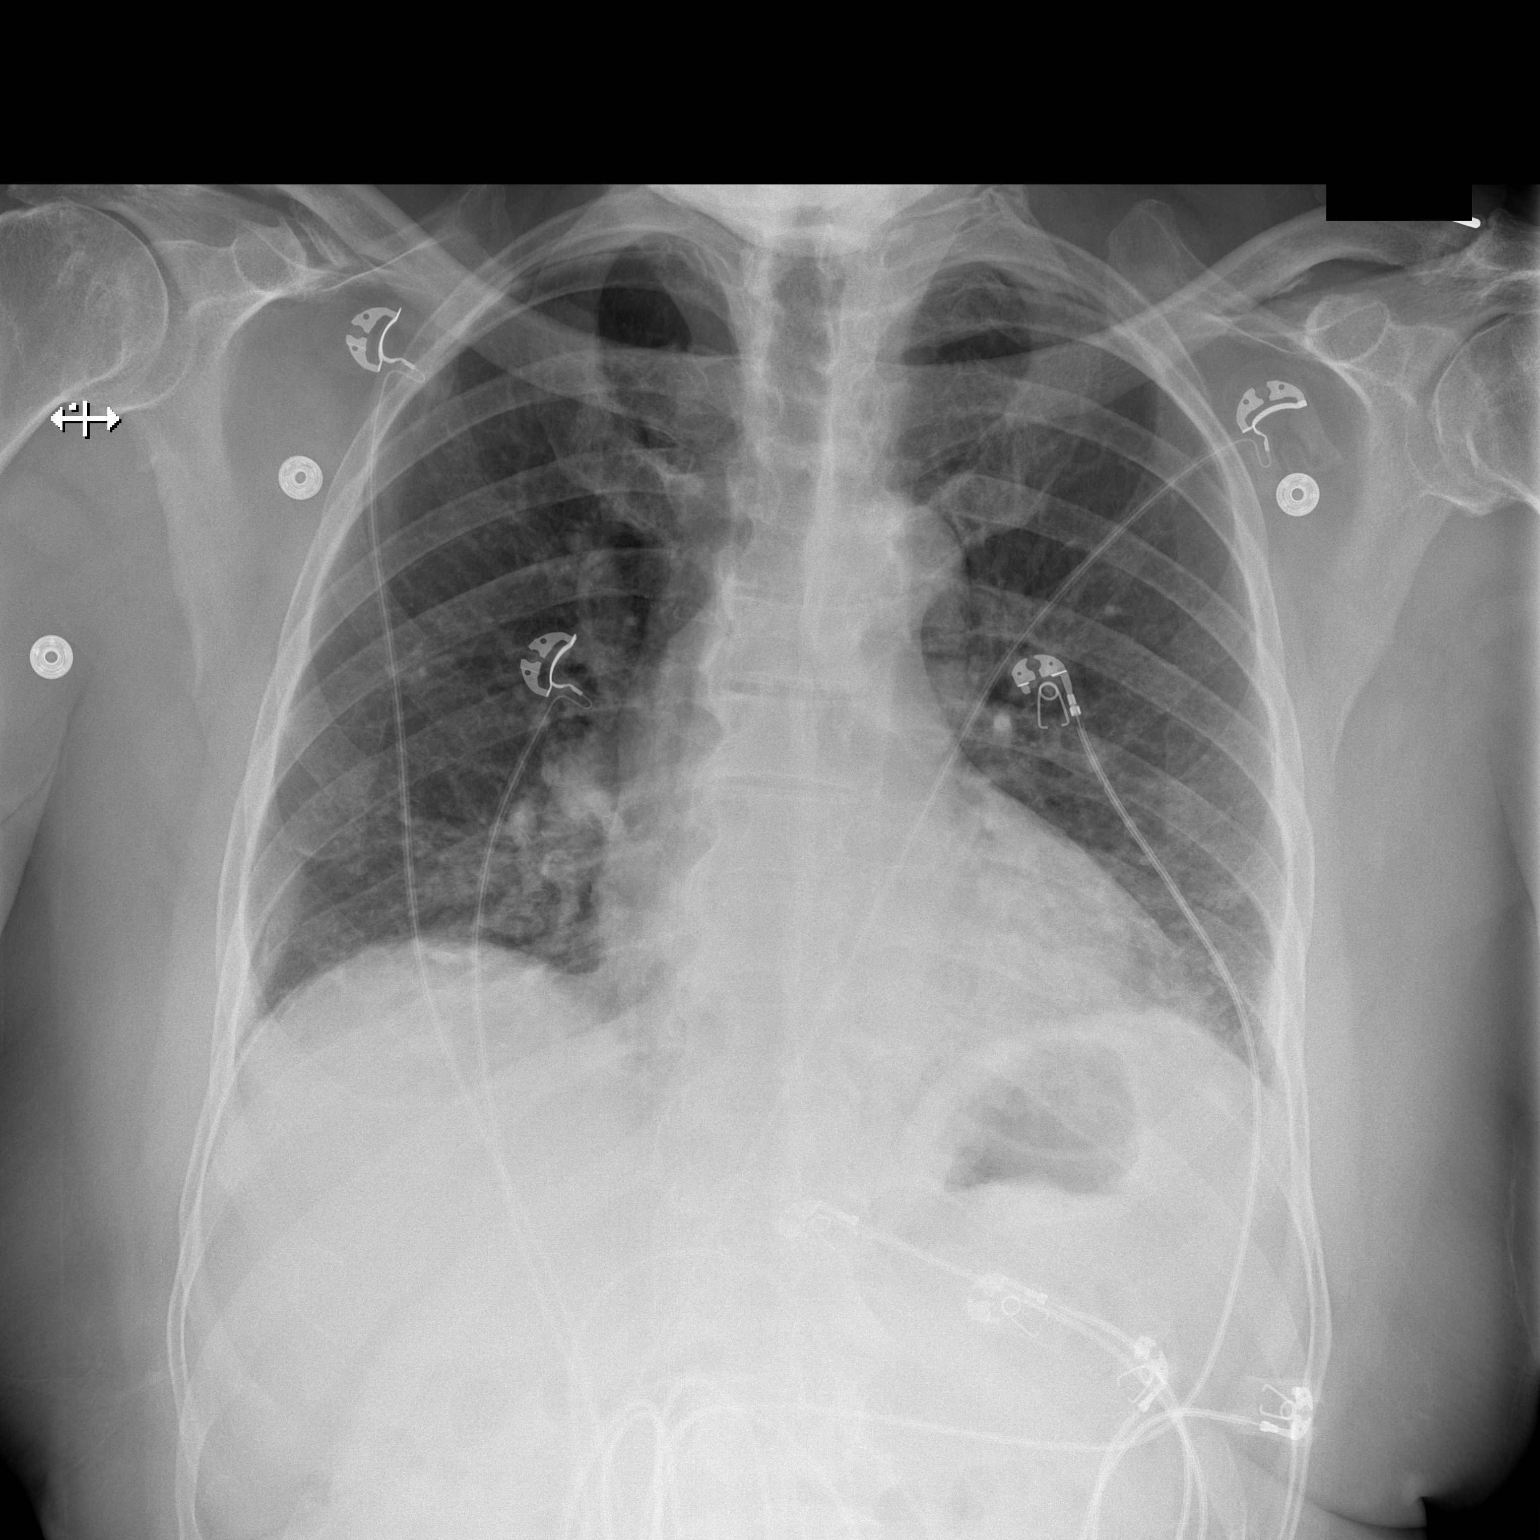

[w chest lat]
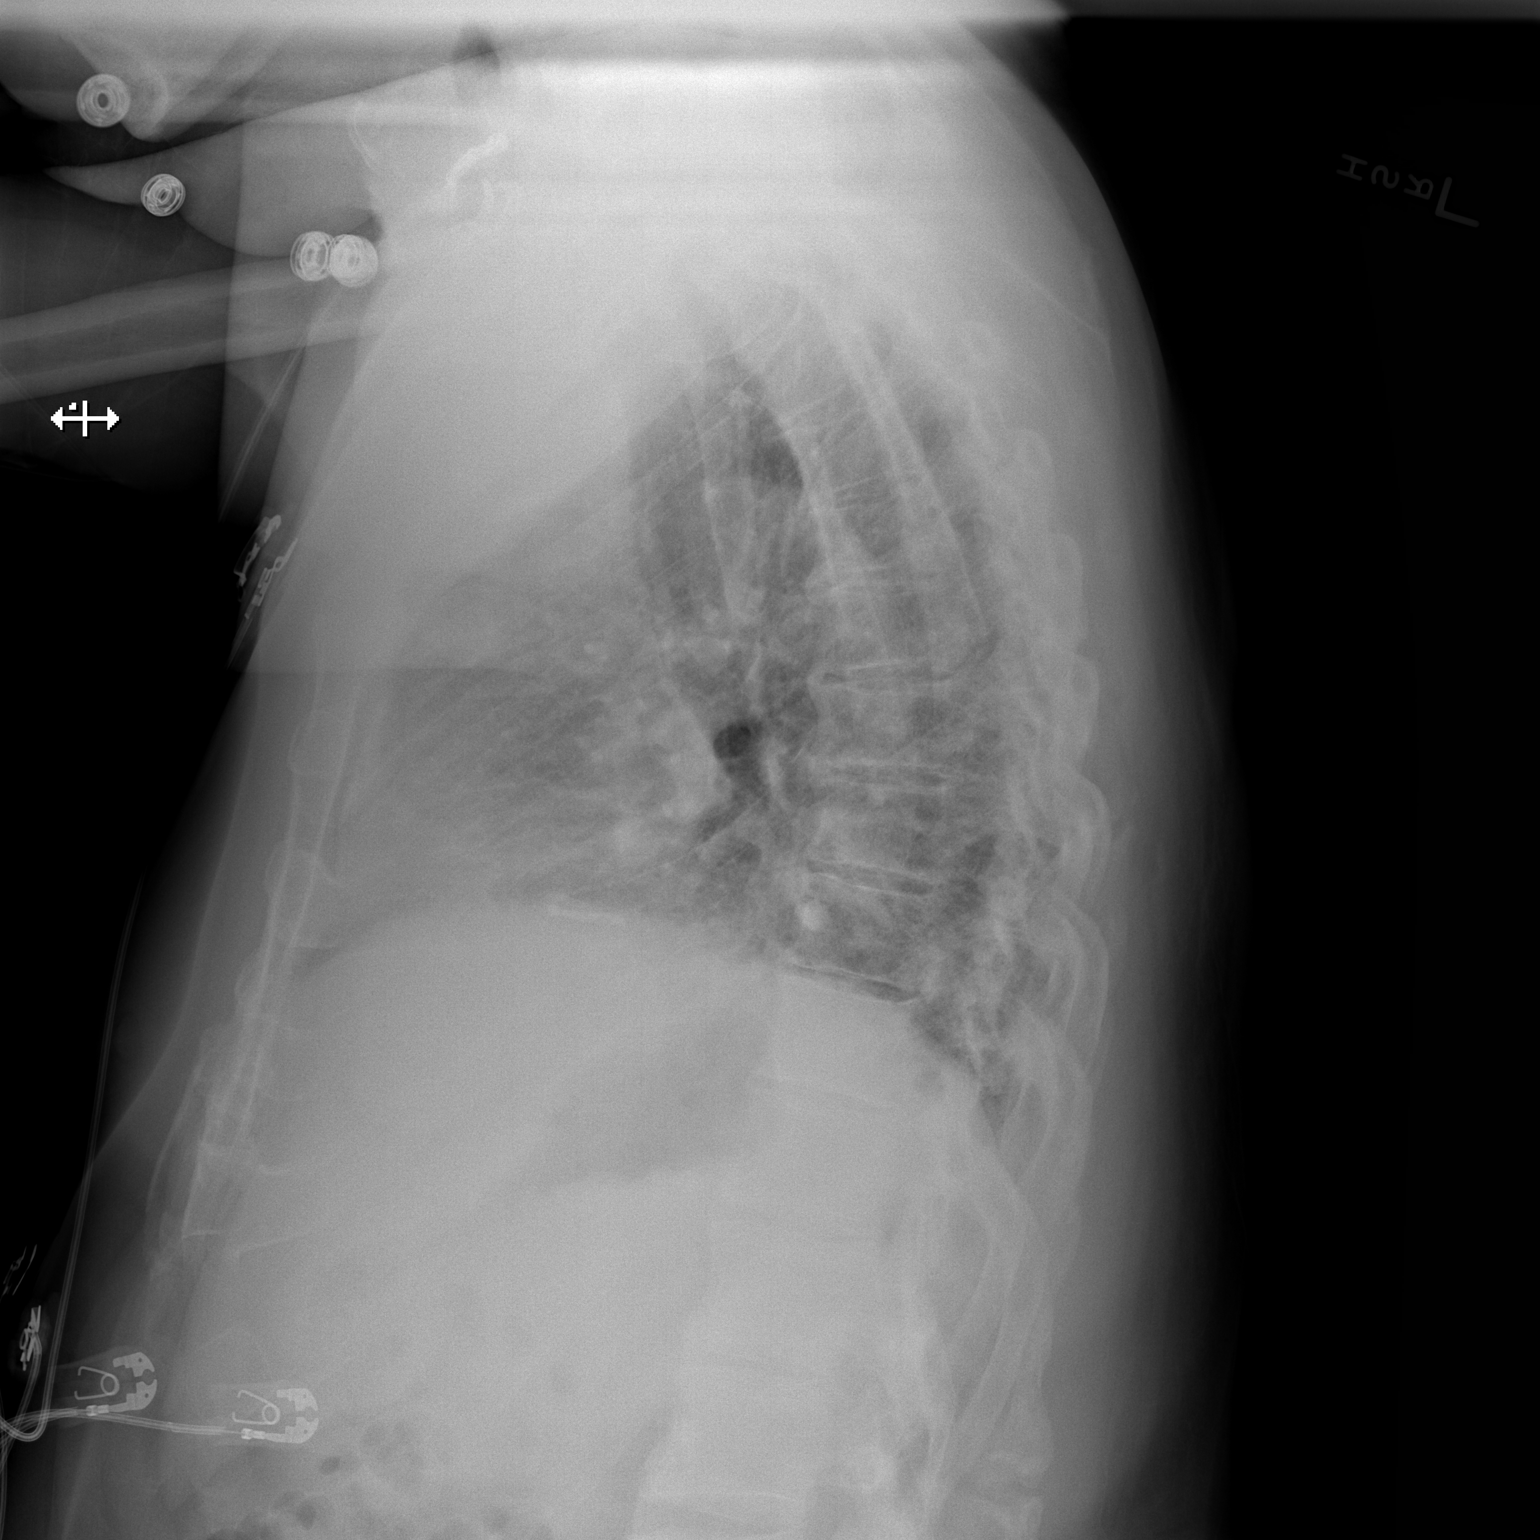

[2 of 2 positions shown; findings below may reference images not displayed]

FINDINGS: Normal cardiac silhouette. Calcifications over the RIGHT
hemidiaphragm. Central venous congestion. No focal consolidation. No
pneumothorax.
IMPRESSION: 1. Calcified pleural plaques of the hemidiaphragms.
2. Central venous pulmonary congestion.

## 2018-08-16 MED ORDER — INSULIN ASPART 100 UNIT/ML ~~LOC~~ SOLN: 0.0000 [IU] | Freq: Every day | SUBCUTANEOUS | Status: DC

## 2018-08-16 MED ORDER — ALLOPURINOL 300 MG PO TABS
300.0000 mg | ORAL_TABLET | Freq: Every day | ORAL | Status: DC
  Administered 2018-08-16 – 2018-08-17 (×2): 300 mg via ORAL
  Filled 2018-08-16 (×2): qty 1

## 2018-08-16 MED ORDER — ENOXAPARIN SODIUM 40 MG/0.4ML ~~LOC~~ SOLN
40.0000 mg | SUBCUTANEOUS | Status: DC
  Administered 2018-08-16 – 2018-08-17 (×2): 40 mg via SUBCUTANEOUS
  Filled 2018-08-16 (×2): qty 0.4

## 2018-08-16 MED ORDER — ASPIRIN 81 MG PO CHEW
162.0000 mg | CHEWABLE_TABLET | Freq: Once | ORAL | Status: AC
  Administered 2018-08-16: 162 mg via ORAL
  Filled 2018-08-16: qty 2

## 2018-08-16 MED ORDER — ASPIRIN EC 81 MG PO TBEC
81.0000 mg | DELAYED_RELEASE_TABLET | Freq: Every day | ORAL | Status: DC
  Administered 2018-08-17: 81 mg via ORAL
  Filled 2018-08-16: qty 1

## 2018-08-16 MED ORDER — LOSARTAN POTASSIUM-HCTZ 100-12.5 MG PO TABS: 1.0000 | ORAL_TABLET | Freq: Every morning | ORAL | Status: DC

## 2018-08-16 MED ORDER — ACETAMINOPHEN 325 MG PO TABS: 650.0000 mg | ORAL_TABLET | ORAL | Status: DC | PRN

## 2018-08-16 MED ORDER — ONDANSETRON HCL 4 MG/2ML IJ SOLN: 4.0000 mg | Freq: Four times a day (QID) | INTRAMUSCULAR | Status: DC | PRN

## 2018-08-16 MED ORDER — SIMVASTATIN 20 MG PO TABS
40.0000 mg | ORAL_TABLET | Freq: Every evening | ORAL | Status: DC
  Administered 2018-08-16: 40 mg via ORAL
  Filled 2018-08-16: qty 2

## 2018-08-16 MED ORDER — HYDROCHLOROTHIAZIDE 12.5 MG PO CAPS
12.5000 mg | ORAL_CAPSULE | Freq: Every day | ORAL | Status: DC
  Administered 2018-08-16 – 2018-08-17 (×2): 12.5 mg via ORAL
  Filled 2018-08-16 (×2): qty 1

## 2018-08-16 MED ORDER — MORPHINE SULFATE (PF) 2 MG/ML IV SOLN
2.0000 mg | INTRAVENOUS | Status: DC | PRN
  Administered 2018-08-16 (×2): 2 mg via INTRAVENOUS
  Filled 2018-08-16 (×2): qty 1

## 2018-08-16 MED ORDER — LOSARTAN POTASSIUM 50 MG PO TABS
100.0000 mg | ORAL_TABLET | Freq: Every day | ORAL | Status: DC
  Administered 2018-08-16 – 2018-08-17 (×2): 100 mg via ORAL
  Filled 2018-08-16 (×2): qty 2

## 2018-08-16 MED ORDER — INSULIN ASPART 100 UNIT/ML ~~LOC~~ SOLN
0.0000 [IU] | Freq: Three times a day (TID) | SUBCUTANEOUS | Status: DC
  Administered 2018-08-16 – 2018-08-17 (×3): 1 [IU] via SUBCUTANEOUS

## 2018-08-16 MED ORDER — NITROGLYCERIN 0.4 MG SL SUBL
0.4000 mg | SUBLINGUAL_TABLET | SUBLINGUAL | Status: DC | PRN
  Administered 2018-08-16 (×3): 0.4 mg via SUBLINGUAL
  Filled 2018-08-16 (×3): qty 1

## 2018-08-16 NOTE — ED Notes (Signed)
Pt transported to xray 

## 2018-08-16 NOTE — ED Notes (Signed)
Got patient [place] sandwich patient is resting with family at bedside and call bell in reach

## 2018-08-16 NOTE — H&P (Signed)
History and Physical    Garrett Snyder AST:[id number] DOB: 1951-05-03 DOA: 08/16/2018  PCP: [name], MD  Patient coming from: home    I have personally briefly reviewed patient's old medical records available.   Chief Complaint: chest pain   HPI: Garrett Snyder is a 68 y.o. male with medical history significant of well-controlled hypertension, diabetes type 2 on oral hypoglycemics reportedly well controlled, CKD stage III, hyperlipidemia presenting with sudden onset of chest pain.  According to the patient, he was watching TV at 6 AM in the morning, suddenly started feeling pressure-like sensation, moderate in intensity, left precordium, no radiation.  He took a baby aspirin before coming to hospital.  Denies any nausea vomiting diaphoresis shortness of breath or wheezing.  Denies any trauma.  Denies any cough cold flulike symptoms. Denies any history of angina.  He had similar pain 3 years ago and was admitted to our hospital.  He had a Lexiscan done that was essentially normal. ED Course: Hemodynamically stable.  Blood pressures were slightly elevated on presentation that improved.  Creatinine is 1.5 which is at about his baseline.  He denies any chest pain after receiving aspirin in route.  Chest x-ray looks essentially normal.  Point-of-care troponin and EKG nonischemic.  Due to high risk factors, heart score 5, patient was advised monitoring in the hospital.  Review of Systems: As per HPI otherwise 10 point review of systems negative.    Past Medical History:  Diagnosis Date  . Diabetes mellitus without complication ([place])   . Hyperlipidemia   . Hypertension     Past Surgical History:  Procedure Laterality Date  . NO PAST SURGERIES       reports that he has never smoked. He has never used smokeless tobacco. He reports that he does not drink alcohol or use drugs.  No Known Allergies  Family History  Family history unknown: Yes     Prior to Admission medications     Medication Sig Start Date End Date Taking? Authorizing Provider  allopurinol (ZYLOPRIM) 300 MG tablet Take 300 mg by mouth daily.    Yes [provider]  aspirin 81 MG tablet Take 81 mg by mouth daily.    Yes [provider]  losartan-hydrochlorothiazide (HYZAAR) 100-12.5 MG tablet Take 1 tablet by mouth every morning. 06/24/18  Yes [provider]  metFORMIN (GLUCOPHAGE) 1000 MG tablet Take 1,000 mg by mouth 2 (two) times daily with a meal.   Yes [provider]  simvastatin (ZOCOR) 40 MG tablet Take 40 mg by mouth every evening.   Yes [provider]  sitaGLIPtin (JANUVIA) 100 MG tablet Take 100 mg by mouth daily.   Yes [provider]    Physical Exam: Vitals:   08/16/18 0915 08/16/18 0930 08/16/18 1015 08/16/18 1045  BP: (!) 155/97 (!) 153/95 132/85 122/88  Pulse: 82 84 88 90  Resp: 18 18 20  (!) 21  Temp:      TempSrc:      SpO2: 97% 98% 99% 100%  Weight:      Height:        Constitutional: NAD, calm, comfortable Vitals:   08/16/18 0915 08/16/18 0930 08/16/18 1015 08/16/18 1045  BP: (!) 155/97 (!) 153/95 132/85 122/88  Pulse: 82 84 88 90  Resp: 18 18 20  (!) 21  Temp:      TempSrc:      SpO2: 97% 98% 99% 100%  Weight:      Height:  Eyes: PERRL, lids and conjunctivae normal ENMT: Mucous membranes are moist. Posterior pharynx clear of any exudate or lesions.Normal dentition.  Neck: normal, supple, no masses, no thyromegaly Respiratory: clear to auscultation bilaterally, no wheezing, no crackles. Normal respiratory effort. No accessory muscle use.  Cardiovascular: Regular rate and rhythm, no murmurs / rubs / gallops. No extremity edema. 2+ pedal pulses. No carotid bruits.  Abdomen: no tenderness, no masses palpated. No hepatosplenomegaly. Bowel sounds positive.  Musculoskeletal: no clubbing / cyanosis. No joint deformity upper and lower extremities. Good ROM, no contractures. Normal muscle tone.  Skin: no rashes,  lesions, ulcers. No induration Neurologic: CN 2-12 grossly intact. Sensation intact, DTR normal. Strength 5/5 in all 4.  Psychiatric: Normal judgment and insight. Alert and oriented x 3. Normal mood.   Labs on Admission: I have personally reviewed following labs and imaging studies  CBC: Recent Labs  Lab 08/16/18 0854  WBC 8.6  NEUTROABS 6.4  HGB 15.1  HCT 49.7  MCV 87.8  PLT 132*   Basic Metabolic Panel: Recent Labs  Lab 08/16/18 0854  NA 139  K 4.4  CL 104  CO2 26  GLUCOSE 121*  BUN 18  CREATININE 1.52*  CALCIUM 9.7   GFR: Estimated Creatinine Clearance: 55.1 mL/min (A) (by C-G formula based on SCr of 1.52 mg/dL (H)). Liver Function Tests: No results for input(s): AST, ALT, ALKPHOS, BILITOT, PROT, ALBUMIN in the last 168 hours. No results for input(s): LIPASE, AMYLASE in the last 168 hours. No results for input(s): AMMONIA in the last 168 hours. Coagulation Profile: No results for input(s): INR, PROTIME in the last 168 hours. Cardiac Enzymes: No results for input(s): CKTOTAL, CKMB, CKMBINDEX, TROPONINI in the last 168 hours. BNP (last 3 results) No results for input(s): PROBNP in the last 8760 hours. HbA1C: No results for input(s): HGBA1C in the last 72 hours. CBG: No results for input(s): GLUCAP in the last 168 hours. Lipid Profile: No results for input(s): CHOL, HDL, LDLCALC, TRIG, CHOLHDL, LDLDIRECT in the last 72 hours. Thyroid Function Tests: No results for input(s): TSH, T4TOTAL, FREET4, T3FREE, THYROIDAB in the last 72 hours. Anemia Panel: No results for input(s): VITAMINB12, FOLATE, FERRITIN, TIBC, IRON, RETICCTPCT in the last 72 hours. Urine analysis:    Component Value Date/Time   COLORURINE YELLOW 07/21/2010 1843   APPEARANCEUR CLEAR 07/21/2010 1843   LABSPEC >1.046 (H) 07/21/2010 1843   PHURINE 5.0 07/21/2010 1843   HGBUR NEGATIVE 07/21/2010 1843   BILIRUBINUR NEGATIVE 07/21/2010 1843   KETONESUR >80 (A) 07/21/2010 1843   PROTEINUR NEGATIVE  07/21/2010 1843   UROBILINOGEN 0.2 07/21/2010 1843   NITRITE NEGATIVE 07/21/2010 1843   LEUKOCYTESUR NEGATIVE 07/21/2010 1843    Radiological Exams on Admission: Dg Chest 2 View  Result Date: 08/16/2018 CLINICAL DATA:  Chest pain for 2 hours EXAM: CHEST - 2 VIEW COMPARISON:  None. FINDINGS: Normal cardiac silhouette. Calcifications over the RIGHT hemidiaphragm. Central venous congestion. No focal consolidation. No pneumothorax. IMPRESSION: 1. Calcified pleural plaques of the hemidiaphragms. 2. Central venous pulmonary congestion. Electronically Signed   By: Suzy Bouchard M.D.   On: 08/16/2018 09:31    EKG: Independently reviewed.  Twelve-lead EKG shows sinus rhythm.  No ST-T wave changes.  Assessment/Plan Active Problems:   Left-sided chest wall pain   Essential hypertension   Type 2 diabetes mellitus with hyperlipidemia (H[facility])   Chest pain     1.  Chest pain: Left precordial chest pain.  Heart score 5.  Currently chest pain-free. We will admit  patient to the telemetry unit given severity of symptoms. Currently chest pain improved Cycle EKG and troponins. Supplemental oxygen to keep saturations more than 90%. Aspirin 81 mg daily.  Patient is already on simvastatin. Nitroglycerin and Morphine for severe and recurrent pain. If biomarkers remain negative, will order Lexiscan in the morning.  2.  Type 2 diabetes: Fairly controlled as per patient.  Will keep on sliding scale insulin.  Hold metformin and Januvia while in the hospital.  3.  Hypertension: Stable.  Resume home medications.  He has not received morning medications, will resume his home medications.  4.  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  Historically at about his baseline.    DVT prophylaxis: Lovenox Code Status: Full code Family Communication: Wife at the bedside Disposition Plan: Home.  Anticipate tomorrow. Consults called: None Admission status: Observation.   Barb Merino MD [facility] Pager [phone]  If 7PM-7AM, please contact night-coverage [email or web address] Password [facility]  08/16/2018, 11:37 AM

## 2018-08-16 NOTE — ED Notes (Signed)
Got patient undress on the monitor did ekg shown to er doctor patient is resting with family and call bell in reach

## 2018-08-16 NOTE — ED Provider Notes (Signed)
[facility] EMERGENCY DEPARTMENT Provider Note   CSN: [id number] Arrival date & time: 08/16/18  0809    History   Chief Complaint Chief Complaint  Patient presents with  . Chest Pain    HPI Garrett Snyder is a 68 y.o. male w PMHx T2DM, HTN, HLD, presenting with sudden onset of chest pain that began at rest. Pain is desccribed as a tightness, left sided, and without radiation. Pain started around 6am this morning and is still present on evaluation. He treated sx with a baby aspirin this morning. Has not had home meds yet. Denies assoc SOB, N/V, diaphoresis, heartburn, LE edema, hemoptysis, prolonged immobilization. No recent illnesses.  Per chart review, patient had normal stress test in 2017.     The history is provided by the patient and medical records.    Past Medical History:  Diagnosis Date  . Diabetes mellitus without complication ([place])   . Hyperlipidemia   . Hypertension     Patient Active Problem List   Diagnosis Date Noted  . Essential hypertension 08/16/2018  . Type 2 diabetes mellitus with hyperlipidemia ([place]) 08/16/2018  . Chest pain 08/16/2018  . Left-sided chest wall pain 10/28/2015    Past Surgical History:  Procedure Laterality Date  . NO PAST SURGERIES          Home Medications    Prior to Admission medications   Medication Sig Start Date End Date Taking? Authorizing Provider  allopurinol (ZYLOPRIM) 300 MG tablet Take 300 mg by mouth daily.    Yes [provider]  aspirin 81 MG tablet Take 81 mg by mouth daily.    Yes [provider]  losartan-hydrochlorothiazide (HYZAAR) 100-12.5 MG tablet Take 1 tablet by mouth every morning. 06/24/18  Yes [provider]  metFORMIN (GLUCOPHAGE) 1000 MG tablet Take 1,000 mg by mouth 2 (two) times daily with a meal.   Yes [provider]  simvastatin (ZOCOR) 40 MG tablet Take 40 mg by mouth every evening.   Yes [provider]  sitaGLIPtin (JANUVIA)  100 MG tablet Take 100 mg by mouth daily.   Yes [provider]    Family History Family History  Family history unknown: Yes    Social History Social History   Tobacco Use  . Smoking status: Never Smoker  . Smokeless tobacco: Never Used  Substance Use Topics  . Alcohol use: No  . Drug use: No     Allergies   Patient has no known allergies.   Review of Systems Review of Systems  Constitutional: Negative for diaphoresis.  Respiratory: Negative for shortness of breath.   Cardiovascular: Positive for chest pain. Negative for palpitations and leg swelling.  Gastrointestinal: Negative for abdominal pain and nausea.  All other systems reviewed and are negative.    Physical Exam Updated Vital Signs BP (!) 154/91   Pulse (!) 105   Temp 97.9 F (36.6 C) (Oral)   Resp (!) 26   Ht 6' (1.829 m)   Wt 98.9 kg   SpO2 100%   BMI 29.57 kg/m   Physical Exam Vitals signs and nursing note reviewed.  Constitutional:      General: He is not in acute distress.    Appearance: He is well-developed. He is not ill-appearing.  HENT:     Head: Normocephalic and atraumatic.  Eyes:     Conjunctiva/sclera: Conjunctivae normal.  Neck:     Musculoskeletal: Normal range of motion.  Cardiovascular:     Rate and  Rhythm: Normal rate and regular rhythm.     Pulses: Normal pulses.     Heart sounds: Normal heart sounds.  Pulmonary:     Effort: Pulmonary effort is normal. No respiratory distress.     Breath sounds: Normal breath sounds.  Abdominal:     General: Bowel sounds are normal.     Palpations: Abdomen is soft.     Tenderness: There is no abdominal tenderness. There is no guarding or rebound.  Musculoskeletal:     Right lower leg: No edema.     Left lower leg: No edema.  Skin:    General: Skin is warm.  Neurological:     Mental Status: He is alert.  Psychiatric:        Behavior: Behavior normal.      ED Treatments / Results  Labs (all labs ordered are listed,  but only abnormal results are displayed) Labs Reviewed  BASIC METABOLIC PANEL - Abnormal; Notable for the following components:      Result Value   Glucose, Bld 121 (*)    Creatinine, Ser 1.52 (*)    GFR calc non Af Amer 46 (*)    GFR calc Af Amer 53 (*)    All other components within normal limits  CBC WITH DIFFERENTIAL/PLATELET - Abnormal; Notable for the following components:   Platelets 137 (*)    All other components within normal limits  CBG MONITORING, ED - Abnormal; Notable for the following components:   Glucose-Capillary 123 (*)    All other components within normal limits  TROPONIN I  TROPONIN I  TROPONIN I  HIV ANTIBODY (ROUTINE TESTING W REFLEX)  I-STAT TROPONIN, ED    EKG EKG Interpretation  Date/Time:  Tuesday August 16 2018 08:15:01 EDT Ventricular Rate:  77 PR Interval:    QRS Duration: 97 QT Interval:  380 QTC Calculation: 430 R Axis:   21 Text Interpretation:  Sinus rhythm Probable left atrial enlargement Low voltage, precordial leads Borderline ST elevation, anterior leads Baseline wander in lead(s) V2 V3 V4 V5 Confirmed by Gerlene Fee [id number]) on 08/16/2018 8:17:18 AM   Radiology Dg Chest 2 View  Result Date: 08/16/2018 CLINICAL DATA:  Chest pain for 2 hours EXAM: CHEST - 2 VIEW COMPARISON:  None. FINDINGS: Normal cardiac silhouette. Calcifications over the RIGHT hemidiaphragm. Central venous congestion. No focal consolidation. No pneumothorax. IMPRESSION: 1. Calcified pleural plaques of the hemidiaphragms. 2. Central venous pulmonary congestion. Electronically Signed   By: Suzy Bouchard M.D.   On: 08/16/2018 09:31    Procedures Procedures (including critical care time)  Medications Ordered in ED Medications  nitroGLYCERIN (NITROSTAT) SL tablet 0.4 mg (0.4 mg Sublingual Given 08/16/18 0937)  insulin aspart (novoLOG) injection 0-9 Units (1 Units Subcutaneous Given 08/16/18 1227)  insulin aspart (novoLOG) injection 0-5 Units (has no administration in  time range)  allopurinol (ZYLOPRIM) tablet 300 mg (has no administration in time range)  aspirin EC tablet 81 mg (81 mg Oral Not Given 08/16/18 1238)  simvastatin (ZOCOR) tablet 40 mg (has no administration in time range)  acetaminophen (TYLENOL) tablet 650 mg (has no administration in time range)  ondansetron (ZOFRAN) injection 4 mg (has no administration in time range)  enoxaparin (LOVENOX) injection 40 mg (has no administration in time range)  morphine 2 MG/ML injection 2 mg (has no administration in time range)  losartan (COZAAR) tablet 100 mg (has no administration in time range)    And  hydrochlorothiazide (MICROZIDE) capsule 12.5 mg (has no administration in time range)  aspirin chewable tablet 162 mg (162 mg Oral Given 08/16/18 0859)     Initial Impression / Assessment and Plan / ED Course  I have reviewed the triage vital signs and the nursing notes.  Pertinent labs & imaging results that were available during my care of the patient were reviewed by me and considered in my medical decision making (see chart for details).  Clinical Course as of Aug 15 1304  Tue Aug 16, 2018  1105 [facility] accepting admission.   [JR]    Clinical Course User Index [JR] Allea Kassner, [place] N, PA-C       Patient presenting with sudden onset of central/left-sided chest tightness that began at rest, current on evaluation in the ED.  Comorbidities include hypertension, hyperlipidemia, type 2 diabetes.  Last stress test was in 2017 and normal.  Heart and lung exam is normal today.  Initial troponin is negative.  EKG with nonspecific ST changes.  Pain treated with nitroglycerin with resolution.  Given patient's history and risk factors, patient has moderate to high heart score.  Recommend admission for further cardiac work-up.  Patient agreeable.  [facility] accepting admission.  Patient discussed with and evaluated by Dr. Sedonia Small.  The patient appears reasonably stabilized for admission considering the current  resources, flow, and capabilities available in the ED at this time, and I doubt any other [facility] requiring further screening and/or treatment in the ED prior to admission.   Final Clinical Impressions(s) / ED Diagnoses   Final diagnoses:  Central chest pain    ED Discharge Orders    None       Vaida Kerchner, [place] N, PA-C 08/16/18 1307    Maudie Flakes, MD 08/16/18 1423

## 2018-08-16 NOTE — ED Notes (Signed)
PT ambulatory to the restroom, no distress noted. Lunch tray delivered at this time.

## 2018-08-16 NOTE — ED Triage Notes (Signed)
Pt states he developed chest pain about an hour ago. Denies SOB/n/v. Pain is sharp, non radiating.

## 2018-08-16 NOTE — ED Notes (Signed)
ED TO INPATIENT HANDOFF REPORT  ED Nurse Name and Phone #:  Durenda Guthrie [phone]  S Name/Age/Gender Garrett Snyder 68 y.o. male Room/Bed: 015C/015C  Code Status   Code Status: Full Code  Home/SNF/Other Home Patient oriented to: self, place, time and situation Is this baseline? Yes   Triage Complete: Triage complete  Chief Complaint CP  Triage Note Pt states he developed chest pain about an hour ago. Denies SOB/n/v. Pain is sharp, non radiating.    Allergies No Known Allergies  Level of Care/Admitting Diagnosis ED Disposition    ED Disposition Condition Comment   Admit  Hospital Area: [facility] [[id number]]  Level of Care: Cardiac Telemetry [103]  I expect the patient will be discharged within 24 hours: Yes  LOW acuity---Tx typically complete <24 hrs---ACUTE conditions typically can be evaluated <24 hours---LABS likely to return to acceptable levels <24 hours---IS near functional baseline---EXPECTED to return to current living arrangement---NOT newly hypoxic: Meets criteria for 5C-Observation unit  Diagnosis: Chest pain [664403]  Admitting Physician: Barb Merino [[id number]]  Attending Physician: Barb Merino [[id number]]  PT Class (Do Not Modify): Observation [104]  PT Acc Code (Do Not Modify): Observation [10022]       B Medical/Surgery History Past Medical History:  Diagnosis Date  . Diabetes mellitus without complication ([place])   . Hyperlipidemia   . Hypertension    Past Surgical History:  Procedure Laterality Date  . NO PAST SURGERIES       A IV Location/Drains/Wounds Patient Lines/Drains/Airways Status   Active Line/Drains/Airways    Name:   Placement date:   Placement time:   Site:   Days:   Peripheral IV 08/16/18 Left Hand   08/16/18    0824    Hand   less than 1          Intake/Output Last 24 hours No intake or output data in the 24 hours ending 08/16/18 1316  Labs/Imaging Results for orders placed or performed during the hospital  encounter of 08/16/18 (from the past 48 hour(s))  Basic metabolic panel     Status: Abnormal   Collection Time: 08/16/18  8:54 AM  Result Value Ref Range   Sodium 139 135 - 145 mmol/L   Potassium 4.4 3.5 - 5.1 mmol/L   Chloride 104 98 - 111 mmol/L   CO2 26 22 - 32 mmol/L   Glucose, Bld 121 (H) 70 - 99 mg/dL   BUN 18 8 - 23 mg/dL   Creatinine, Ser 1.52 (H) 0.61 - 1.24 mg/dL   Calcium 9.7 8.9 - 10.3 mg/dL   GFR calc non Af Amer 46 (L) >60 mL/min   GFR calc Af Amer 53 (L) >60 mL/min   Anion gap 9 5 - 15    Comment: Performed at [facility], [place] [address] [place], [place] [address]  CBC with Differential     Status: Abnormal   Collection Time: 08/16/18  8:54 AM  Result Value Ref Range   WBC 8.6 4.0 - 10.5 K/uL   RBC 5.66 4.22 - 5.81 MIL/uL   Hemoglobin 15.1 13.0 - 17.0 g/dL   HCT 49.7 39.0 - 52.0 %   MCV 87.8 80.0 - 100.0 fL   MCH 26.7 26.0 - 34.0 pg   MCHC 30.4 30.0 - 36.0 g/dL   RDW 13.5 11.5 - 15.5 %   Platelets 137 (L) 150 - 400 K/uL   nRBC 0.0 0.0 - 0.2 %   Neutrophils Relative % 74 %  Neutro Abs 6.4 1.7 - 7.7 K/uL   Lymphocytes Relative 18 %   Lymphs Abs 1.5 0.7 - 4.0 K/uL   Monocytes Relative 6 %   Monocytes Absolute 0.6 0.1 - 1.0 K/uL   Eosinophils Relative 1 %   Eosinophils Absolute 0.1 0.0 - 0.5 K/uL   Basophils Relative 1 %   Basophils Absolute 0.0 0.0 - 0.1 K/uL   Immature Granulocytes 0 %   Abs Immature Granulocytes 0.03 0.00 - 0.07 K/uL    Comment: Performed at [facility] [address], [place], [place] [address]  I-Stat Troponin, ED (not at [facility])     Status: None   Collection Time: 08/16/18  9:16 AM  Result Value Ref Range   Troponin i, poc 0.00 0.00 - 0.08 ng/mL   Comment 3            Comment: Due to the release kinetics of cTnI, a negative result within the first hours of the onset of symptoms does not rule out myocardial infarction with certainty. If myocardial infarction is still suspected, repeat the test at appropriate  intervals.   CBG monitoring, ED     Status: Abnormal   Collection Time: 08/16/18 11:55 AM  Result Value Ref Range   Glucose-Capillary 123 (H) 70 - 99 mg/dL   Dg Chest 2 View  Result Date: 08/16/2018 CLINICAL DATA:  Chest pain for 2 hours EXAM: CHEST - 2 VIEW COMPARISON:  None. FINDINGS: Normal cardiac silhouette. Calcifications over the RIGHT hemidiaphragm. Central venous congestion. No focal consolidation. No pneumothorax. IMPRESSION: 1. Calcified pleural plaques of the hemidiaphragms. 2. Central venous pulmonary congestion. Electronically Signed   By: Suzy Bouchard M.D.   On: 08/16/2018 09:31    Pending Labs Unresulted Labs (From admission, onward)    Start     Ordered   08/16/18 1226  HIV antibody (Routine Testing)  Once,   R     08/16/18 1225   08/16/18 1135  Troponin I - Now Then Q6H  Now then every 6 hours,   R     08/16/18 1134          Vitals/Pain Today's Vitals   08/16/18 1045 08/16/18 1130 08/16/18 1215 08/16/18 1245  BP: 122/88 (!) 151/94 (!) 143/88 (!) 154/91  Pulse: 90 95 (!) 103 (!) 105  Resp: (!) 21 (!) 26 (!) 30 (!) 26  Temp:      TempSrc:      SpO2: 100% 99% 99% 100%  Weight:      Height:      PainSc:        Isolation Precautions No active isolations  Medications Medications  nitroGLYCERIN (NITROSTAT) SL tablet 0.4 mg (0.4 mg Sublingual Given 08/16/18 0937)  insulin aspart (novoLOG) injection 0-9 Units (1 Units Subcutaneous Given 08/16/18 1227)  insulin aspart (novoLOG) injection 0-5 Units (has no administration in time range)  allopurinol (ZYLOPRIM) tablet 300 mg (has no administration in time range)  aspirin EC tablet 81 mg (81 mg Oral Not Given 08/16/18 1238)  simvastatin (ZOCOR) tablet 40 mg (has no administration in time range)  acetaminophen (TYLENOL) tablet 650 mg (has no administration in time range)  ondansetron (ZOFRAN) injection 4 mg (has no administration in time range)  enoxaparin (LOVENOX) injection 40 mg (has no administration in time  range)  morphine 2 MG/ML injection 2 mg (has no administration in time range)  losartan (COZAAR) tablet 100 mg (has no administration in time range)    And  hydrochlorothiazide (MICROZIDE)  capsule 12.5 mg (has no administration in time range)  aspirin chewable tablet 162 mg (162 mg Oral Given 08/16/18 0859)    Mobility walks Low fall risk   Focused Assessments Cardiac Assessment Handoff:  Cardiac Rhythm: Normal sinus rhythm Lab Results  Component Value Date   CKTOTAL 71 06/12/2008   CKMB 1.5 06/12/2008   TROPONINI <0.01        NO INDICATION OF MYOCARDIAL INJURY. 06/12/2008   Lab Results  Component Value Date   DDIMER <0.27 10/10/2012   Does the Patient currently have chest pain? C/o soreness    R Recommendations: See Admitting Provider Note  Report given to:   Additional Notes:

## 2018-08-17 ENCOUNTER — Observation Stay (HOSPITAL_BASED_OUTPATIENT_CLINIC_OR_DEPARTMENT_OTHER): Payer: Medicare Other

## 2018-08-17 ENCOUNTER — Other Ambulatory Visit: Payer: Self-pay

## 2018-08-17 DIAGNOSIS — E785 Hyperlipidemia, unspecified: Secondary | ICD-10-CM

## 2018-08-17 DIAGNOSIS — R079 Chest pain, unspecified: Secondary | ICD-10-CM

## 2018-08-17 DIAGNOSIS — I10 Essential (primary) hypertension: Secondary | ICD-10-CM | POA: Diagnosis not present

## 2018-08-17 DIAGNOSIS — Z8739 Personal history of other diseases of the musculoskeletal system and connective tissue: Secondary | ICD-10-CM

## 2018-08-17 DIAGNOSIS — E1169 Type 2 diabetes mellitus with other specified complication: Secondary | ICD-10-CM

## 2018-08-17 DIAGNOSIS — R0789 Other chest pain: Secondary | ICD-10-CM | POA: Diagnosis not present

## 2018-08-17 DIAGNOSIS — D696 Thrombocytopenia, unspecified: Secondary | ICD-10-CM

## 2018-08-17 LAB — TROPONIN I

## 2018-08-17 LAB — NM MYOCAR MULTI W/SPECT W/WALL MOTION / EF
Estimated workload: 1 METS
MPHR: 150 {beats}/min
Peak HR: 113 {beats}/min
Percent HR: 75 %
Rest HR: 97 {beats}/min

## 2018-08-17 LAB — HIV ANTIBODY (ROUTINE TESTING W REFLEX): HIV Screen 4th Generation wRfx: NONREACTIVE

## 2018-08-17 LAB — GLUCOSE, CAPILLARY
GLUCOSE-CAPILLARY: 150 mg/dL — AB (ref 70–99)
GLUCOSE-CAPILLARY: 140 mg/dL — AB (ref 70–99)
Glucose-Capillary: 131 mg/dL — ABNORMAL HIGH (ref 70–99)

## 2018-08-17 MED ORDER — REGADENOSON 0.4 MG/5ML IV SOLN
0.4000 mg | Freq: Once | INTRAVENOUS | Status: AC
  Administered 2018-08-17: 0.4 mg via INTRAVENOUS
  Filled 2018-08-17: qty 5

## 2018-08-17 MED ORDER — TECHNETIUM TC 99M TETROFOSMIN IV KIT
10.0000 | PACK | Freq: Once | INTRAVENOUS | Status: AC | PRN
  Administered 2018-08-17: 10 via INTRAVENOUS

## 2018-08-17 MED ORDER — TECHNETIUM TC 99M TETROFOSMIN IV KIT
30.0000 | PACK | Freq: Once | INTRAVENOUS | Status: AC | PRN
  Administered 2018-08-17: 30 via INTRAVENOUS

## 2018-08-17 MED ORDER — REGADENOSON 0.4 MG/5ML IV SOLN
INTRAVENOUS | Status: AC
  Administered 2018-08-17: 0.4 mg via INTRAVENOUS
  Filled 2018-08-17: qty 5

## 2018-08-17 NOTE — Discharge Summary (Addendum)
Garrett Snyder, is a 68 y.o. male  DOB [birth year]  MRN [id number].  Admission date:  08/16/2018  Admitting Physician  Barb Merino, MD  Discharge Date:  08/17/2018   Primary MD  [name], MD  Recommendations for primary care physician for things to follow:   -Question need of change of blood pressure medication due to patient's kidney function as no more recent labs available -Follow-up Cholesterol  -Follow-up liver function given thrombocytopenia  Discharge Diagnosis   Principal Problem:   Chest pain Active Problems:   Left-sided chest wall pain   Essential hypertension   Type 2 diabetes mellitus with hyperlipidemia (H[facility])   History of gout   Thrombocytopenia ([place])      Past Medical History:  Diagnosis Date   Diabetes mellitus without complication ([place])    Hyperlipidemia    Hypertension     Past Surgical History:  Procedure Laterality Date   NO PAST SURGERIES         HPI  from the history and physical done on the day of admission:   Garrett Snyder is a 68 y.o. male with medical history significant of well- controlled hypertension, diabetes type 2 on oral hypoglycemics reportedly well controlled, CKD stage III, hyperlipidemia presenting with sudden onset of chest pain.  According to the patient, he was watching TV at 6 AM in the morning, suddenly started feeling pressure-like sensation, moderate in intensity, left precordium, no radiation.  He took a baby aspirin before coming to hospital.  Denies any nausea vomiting diaphoresis shortness of breath or wheezing.  Denies any trauma.  Denies any cough cold flulike symptoms.  Denies any history of angina.  He had similar pain 3 years ago and was admitted to our hospital.  He had a Lexiscan done that was essentially normal.  ED Course: Hemodynamically stable.   Blood pressures were slightly elevated on presentation that improved.  Creatinine is 1.5 which is at about his baseline.  He denies any chest pain after receiving aspirin in route.  Chest x-ray looks essentially normal.  Point-of-care troponin and EKG nonischemic.  Due to high risk factors, heart score 5, patient was advised monitoring in the hospital.     Hospital Course:     1. Chest pain: Left precordial chest pain.  Heart score 5.  Troponin initially neagtive.  Patient was given aspirin and was continued on his home dose of statin. Nitroglycerin and Morphine were provided as needed for severe and recurrent pain. Lexiscan was ordered as biomarkers remain negative.  Lexiscan revealed no reversible ischemia or infarction focal area of decreased uptake involving the inferior lateral wall of both rest and stress imaging corresponding to likely artifact. Patient chest pain was thought to be less cardiac in nature and patient was okay for discharge.   2.  Type 2 diabetes: Fairly controlled as per patient.  Will keep on sliding scale insulin.  Held metformin and Januvia while in the hospital.  Patient was advised to restart these medications at discharge.  3.  Hypertension: Stable.  Patient was taking lisinopril hydrochlorothiazide combination pill at home.  Questioned if patient needed to be on different medication given decreased overall kidney function.  4.  CKD stage III: Creatinine was noted to be 1.52 which could be near the patient baseline as record last note creatinine 1.58  In 10/2015.  5.  Hyperlipidemia: Patient's lipid panel was not obtained on his hospital stay.  He was continued on simvastatin  6.  History of gout: Patient without any acute flareup during his stay.  Continued on allopurinol at discharge.  7.  Thrombocytopenia: Chronic.  Patient has had low platelet counts seen as far back as 2010.  Unknown cause of symptoms at this time. Dnies any reports of bleeding.  Follow UP      Consults obtained - None  Discharge Condition: Stable   Diet and Activity recommendation: See Discharge Instructions below   Discharge Instructions    Discharge instructions   Complete by:  As directed    Call and make an appointment with Primary MD [name], MD in 1 to 2 weeks.  You were evaluated in the hospital for complaints of chest pain.  There was no clear signs of a heart blockage on the stress test performed as a cause for your chest pain symptoms.  It is recommended that you continue to take a daily aspirin, but no further heart work-up is needed in the hospital setting.  Lab work during her hospital stay noted that your kidney function is mildly decreased.  Please discuss your blood pressure medication of lisinopril-hydrochlorothiazide with your primary doctor because the combination medication could be leading to worsening kidney function and may need to be changed.   Get CBC and BMP-  checked  by Primary MD after next office visit( we routinely change or add medications that can affect your baseline labs and fluid status, therefore we recommend that you get the mentioned basic workup next visit with your PCP, your PCP may decide not to get them or add new tests based on their clinical decision)  Activity: As tolerated  Disposition: Home   Diet: heart healthy/carbohydrate modified  Special Instructions: If you have smoked or chewed Tobacco  in the last 2 yrs please stop smoking, stop any regular Alcohol  and or any Recreational drug use.  On your next visit with your primary care physician please Get Medicines reviewed and adjusted.  Please request your [name], MD to go over all Hospital Tests and Procedure/Radiological results at the follow up, please get all Hospital records sent to your Prim MD by signing hospital release before you go home.  If you experience worsening of your admission symptoms, develop shortness of breath, life threatening emergency,  suicidal or homicidal thoughts you must seek medical attention immediately by calling 911 or calling your MD immediately  if symptoms less severe.  You Must read complete instructions/literature along with all the possible adverse reactions/side effects for all the Medicines you take and that have been prescribed to you. Take any new Medicines after you have completely understood and accpet all the possible adverse reactions/side effects.   Do not drive, operate heavy machinery, perform activities at heights, swimming or participation in water activities or provide baby sitting services if your were admitted for syncope or siezures until you have seen by Primary MD or a Neurologist and advised to do so again.  Do not drive when taking Pain medications.  Do not take more than prescribed Pain, Sleep and Anxiety  Medications  Wear Seat belts while driving.   Please note  You were cared for by a hospitalist during your hospital stay. If you have any questions about your discharge medications or the care you received while you were in the hospital after you are discharged, you can call the unit and asked to speak with the hospitalist on call if the hospitalist that took care of you is not available. Once you are discharged, your primary care physician will handle any further medical issues. Please note that NO REFILLS for any discharge medications will be authorized once you are discharged, as it is imperative that you return to your primary care physician (or establish a relationship with a primary care physician if you do not have one) for your aftercare needs so that they can reassess your need for medications and monitor your lab values.        Discharge Medications     Allergies as of 08/17/2018   No Known Allergies     Medication List    TAKE these medications   allopurinol 300 MG tablet Commonly known as:  ZYLOPRIM Take 300 mg by mouth daily.   aspirin 81 MG tablet Take 81 mg by  mouth daily.   losartan-hydrochlorothiazide 100-12.5 MG tablet Commonly known as:  HYZAAR Take 1 tablet by mouth every morning.   metFORMIN 1000 MG tablet Commonly known as:  GLUCOPHAGE Take 1,000 mg by mouth 2 (two) times daily with a meal.   simvastatin 40 MG tablet Commonly known as:  ZOCOR Take 40 mg by mouth every evening.   sitaGLIPtin 100 MG tablet Commonly known as:  JANUVIA Take 100 mg by mouth daily.       Major procedures and Radiology Reports - PLEASE review detailed and final reports for all details, in brief -      Dg Chest 2 View  Result Date: 08/16/2018 CLINICAL DATA:  Chest pain for 2 hours EXAM: CHEST - 2 VIEW COMPARISON:  None. FINDINGS: Normal cardiac silhouette. Calcifications over the RIGHT hemidiaphragm. Central venous congestion. No focal consolidation. No pneumothorax. IMPRESSION: 1. Calcified pleural plaques of the hemidiaphragms. 2. Central venous pulmonary congestion. Electronically Signed   By: Suzy Bouchard M.D.   On: 08/16/2018 09:31   Nm Myocar Multi W/spect W/wall Motion / Ef  Result Date: 08/17/2018 CLINICAL DATA:  Acute cardiac syndrome. Negative troponin. Chest pain, diabetes, hypertension and elevated lipids. EXAM: MYOCARDIAL IMAGING WITH SPECT (REST AND PHARMACOLOGIC-STRESS) GATED LEFT VENTRICULAR WALL MOTION STUDY LEFT VENTRICULAR EJECTION FRACTION TECHNIQUE: Standard myocardial SPECT imaging was performed after resting intravenous injection of 10 mCi Tc-31m Myoview. Subsequently, intravenous infusion of Lexiscan was performed under the supervision of the Cardiology staff. At peak effect of the drug, 30 mCi Tc-5m Myoview was injected intravenously and standard myocardial SPECT imaging was performed. Quantitative gated imaging was also performed to evaluate left ventricular wall motion, and estimate left ventricular ejection fraction. COMPARISON:  None FINDINGS: Perfusion: There is a focal area of diminished radiotracer activity localizing to  the inferolateral wall on both rest and stress studies. No reversible ischemia identified. Wall Motion: No wall motion abnormality. No left ventricular dilatation. Left Ventricular Ejection Fraction: 63 % End diastolic volume 70 ml End systolic volume 26 ml IMPRESSION: 1. No reversible ischemia or infarction. Focal area of decreased uptake involving the inferolateral wall on both the rest and stress images without corresponding wall motion abnormality is noted. Favored to represent non uniform soft tissue attenuation artifact. 2. Normal left ventricular wall motion.  3. Left ventricular ejection fraction 63% 4. Non invasive risk stratification*: Low *2012 Appropriate Use Criteria for Coronary Revascularization Focused Update: J Am Coll Cardiol. 7341;93(7):902-409. http://content.airportbarriers.com.aspx?articleid=1201161 Electronically Signed   By: Kerby Moors M.D.   On: 08/17/2018 11:47    Micro Results     No results found for this or any previous visit (from the past 240 hour(s)).     Today   Subjective    Garrett Snyder today states that his chest pain has has not reoccurred.   Objective   Blood pressure 96/68, pulse (!) 101, temperature 98.7 F (37.1 C), temperature source Oral, resp. rate 18, height 6' (1.829 m), weight 98.9 kg, SpO2 97 %.   Intake/Output Summary (Last 24 hours) at 08/17/2018 1331 Last data filed at 08/16/2018 2000 Gross per 24 hour  Intake 240 ml  Output --  Net 240 ml    Exam  Constitutional: Elderly male in NAD, calm, comfortable Eyes: PERRL, lids and conjunctivae normal ENMT: Mucous membranes are moist. Posterior pharynx clear of any exudate or lesions. Neck: normal, supple, no masses, no thyromegaly Respiratory: clear to auscultation bilaterally, no wheezing, no crackles. Normal respirato did not in the effort. No accessory muscle use.  Cardiovascular: Regular rate and rhythm, no murmurs / rubs / gallops. No extremity edema. 2+ pedal pulses. No  carotid bruits.  Medical up for an hour examination abdomen: no tenderness, no masses palpated. No hepatosplenomegaly. Bowel sounds positive.  Musculoskeletal: no clubbing / cyanosis. No joint deformity upper and lower extremities. Good ROM, no contractures. Normal muscle tone.  Skin: no rashes, lesions, ulcers. No induration Neurologic: CN 2-12 grossly intact. Sensation intact, DTR normal. Strength 5/5 in all 4.  Psychiatric: Normal judgment and insight. Alert and oriented x 3. Normal mood.    Data Review   CBC w Diff:  Lab Results  Component Value Date   WBC 8.6 08/16/2018   HGB 15.1 08/16/2018   HCT 49.7 08/16/2018   PLT 137 (L) 08/16/2018   LYMPHOPCT 18 08/16/2018   MONOPCT 6 08/16/2018   EOSPCT 1 08/16/2018   BASOPCT 1 08/16/2018    CMP:  Lab Results  Component Value Date   NA 139 08/16/2018   K 4.4 08/16/2018   CL 104 08/16/2018   CO2 26 08/16/2018   BUN 18 08/16/2018   CREATININE 1.52 (H) 08/16/2018   PROT 6.6 06/12/2008   ALBUMIN 3.3 (L) 06/12/2008   BILITOT 1.4 (H) 06/12/2008   ALKPHOS 76 06/12/2008   AST 28 06/12/2008   ALT 28 06/12/2008  .   Total Time in preparing paper work, data evaluation and todays exam - 35 minutes  Norval Morton M.D on 08/17/2018 at [facility]   Office  [phone]

## 2018-08-17 NOTE — Care Management Obs Status (Signed)
[facility] NOTIFICATION   Patient Details  Name: JAYLEN KNOPE MRN: [id number] Date of Birth: [birth year]   Medicare Observation Status Notification Given:  Yes    Midge Minium RN, BSN, NCM-BC, ACM-RN [phone] 08/17/2018, 12:48 PM

## 2018-08-17 NOTE — Progress Notes (Signed)
Patient/spouse given AVS and discharge instructions. PIV removed.  Questions answered. Discharge via wheelchair.

## 2018-08-17 NOTE — Progress Notes (Signed)
1 day lexiscan completed without complication, pending final result by [facility] radiology reader.   Hilbert Corrigan PA Pager: [phone]

## 2018-08-21 DIAGNOSIS — Z8739 Personal history of other diseases of the musculoskeletal system and connective tissue: Secondary | ICD-10-CM

## 2018-08-21 DIAGNOSIS — D696 Thrombocytopenia, unspecified: Secondary | ICD-10-CM | POA: Diagnosis present

## 2020-01-30 DIAGNOSIS — M109 Gout, unspecified: Secondary | ICD-10-CM | POA: Diagnosis not present

## 2020-01-30 DIAGNOSIS — Z8249 Family history of ischemic heart disease and other diseases of the circulatory system: Secondary | ICD-10-CM | POA: Diagnosis not present

## 2020-01-30 DIAGNOSIS — Z7984 Long term (current) use of oral hypoglycemic drugs: Secondary | ICD-10-CM | POA: Diagnosis not present

## 2020-01-30 DIAGNOSIS — I10 Essential (primary) hypertension: Secondary | ICD-10-CM | POA: Diagnosis not present

## 2020-01-30 DIAGNOSIS — E785 Hyperlipidemia, unspecified: Secondary | ICD-10-CM | POA: Diagnosis not present

## 2020-01-30 DIAGNOSIS — Z833 Family history of diabetes mellitus: Secondary | ICD-10-CM | POA: Diagnosis not present

## 2020-01-30 DIAGNOSIS — Z7982 Long term (current) use of aspirin: Secondary | ICD-10-CM | POA: Diagnosis not present

## 2020-01-30 DIAGNOSIS — E119 Type 2 diabetes mellitus without complications: Secondary | ICD-10-CM | POA: Diagnosis not present

## 2020-04-15 DIAGNOSIS — E7800 Pure hypercholesterolemia, unspecified: Secondary | ICD-10-CM | POA: Diagnosis not present

## 2020-04-15 DIAGNOSIS — Z23 Encounter for immunization: Secondary | ICD-10-CM | POA: Diagnosis not present

## 2020-04-15 DIAGNOSIS — M109 Gout, unspecified: Secondary | ICD-10-CM | POA: Diagnosis not present

## 2020-04-15 DIAGNOSIS — C61 Malignant neoplasm of prostate: Secondary | ICD-10-CM | POA: Diagnosis not present

## 2020-04-15 DIAGNOSIS — N1831 Chronic kidney disease, stage 3a: Secondary | ICD-10-CM | POA: Diagnosis not present

## 2020-04-15 DIAGNOSIS — I739 Peripheral vascular disease, unspecified: Secondary | ICD-10-CM | POA: Diagnosis not present

## 2020-04-15 DIAGNOSIS — I129 Hypertensive chronic kidney disease with stage 1 through stage 4 chronic kidney disease, or unspecified chronic kidney disease: Secondary | ICD-10-CM | POA: Diagnosis not present

## 2020-04-15 DIAGNOSIS — E1122 Type 2 diabetes mellitus with diabetic chronic kidney disease: Secondary | ICD-10-CM | POA: Diagnosis not present

## 2020-05-20 DIAGNOSIS — C61 Malignant neoplasm of prostate: Secondary | ICD-10-CM | POA: Diagnosis not present

## 2020-05-27 DIAGNOSIS — C61 Malignant neoplasm of prostate: Secondary | ICD-10-CM | POA: Diagnosis not present

## 2020-05-27 DIAGNOSIS — N400 Enlarged prostate without lower urinary tract symptoms: Secondary | ICD-10-CM | POA: Diagnosis not present

## 2020-06-03 ENCOUNTER — Other Ambulatory Visit: Payer: Self-pay | Admitting: Urology

## 2020-06-03 DIAGNOSIS — C61 Malignant neoplasm of prostate: Secondary | ICD-10-CM

## 2020-07-03 ENCOUNTER — Ambulatory Visit
Admission: RE | Admit: 2020-07-03 | Discharge: 2020-07-03 | Disposition: A | Discharge status: Home / Self Care | Payer: Medicare PPO | Source: Ambulatory Visit | Attending: Urology | Admitting: Urology

## 2020-07-03 DIAGNOSIS — C61 Malignant neoplasm of prostate: Secondary | ICD-10-CM

## 2020-07-03 DIAGNOSIS — R590 Localized enlarged lymph nodes: Secondary | ICD-10-CM | POA: Diagnosis not present

## 2020-07-03 IMAGING — MR MR PROSTATE WO/W CM
14 series · 48 of 48 positions shown · IV contrast (multihance)
Comparison: None.

CLINICAL DATA: Prostate cancer diagnosed [DATE].

EXAM:
MR PROSTATE WITHOUT AND WITH CONTRAST
TECHNIQUE: Multiplanar multisequence MRI images were obtained of the pelvis
centered about the prostate. Pre and post contrast images were
obtained.
CONTRAST:  20mL MULTIHANCE GADOBENATE DIMEGLUMINE 529 MG/ML IV SOLN

[Series 3: T2 · coronal · 3.0mm · 0.56mm/px · 1 of 23 slices shown (1 of 3)]
[im 1/23]
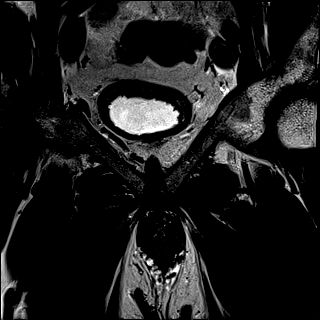

[Series 4: T1 · axial · 5.0mm · 1.25mm/px · 1 of 80 slices shown]
[im 1/80]
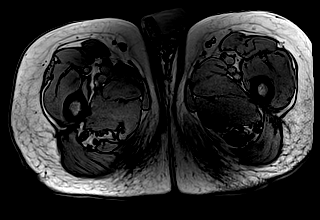

[Series 5: DWI · axial · 3.0mm · 1.75mm/px · 1 of 88 slices shown (1 of 3)]
[im 1/88]
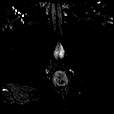

[Series 6: DWI · axial · 3.0mm · 1.75mm/px · 1 of 30 slices shown (2 of 3)]
[im 1/30]
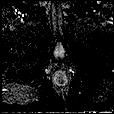

[Series 7: DWI · axial · 3.0mm · 1.75mm/px · 1 of 30 slices shown (3 of 3)]
[im 1/30]
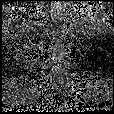

[Series 8: T2 · axial · 3.0mm · 0.56mm/px · 1 of 27 slices shown (2 of 3)]
[im 1/27]
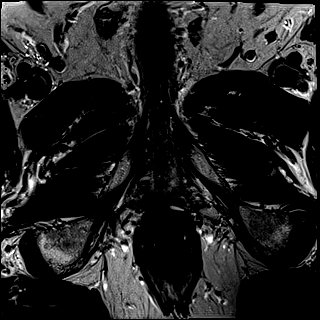

[Series 9: T2 · axial · 1.0mm · 1.04mm/px · 1 of 80 slices shown (3 of 3)]
[im 1/80]
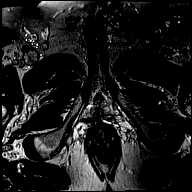

[Series 10: pre t1_twist_tra_dyn · axial · non-contrast · 3.5mm · 0.83mm/px · 1 of 24 slices shown]
[im 1/24]
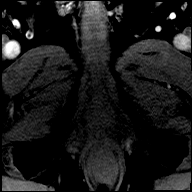

[Series 11: post t1_twist_tra_dyn-copy center · axial · non-contrast · 3.5mm · 0.83mm/px · z∈[-42,+39]mm · 4 of 240 slices shown (1 of 2)]
[im 1/240]
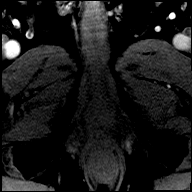
[im 80/240]
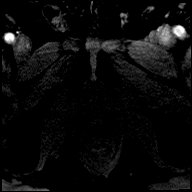
[im 160/240]
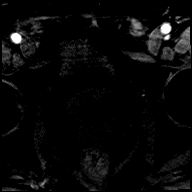
[im 240/240]
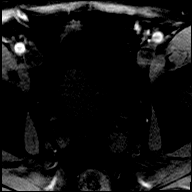

[Series 12: post t1_twist_tra_dyn-copy cent_sub · axial · 3.5mm · 0.83mm/px · z∈[-42,+39]mm · 4 of 212 slices shown (1 of 2)]
[im 1/212]
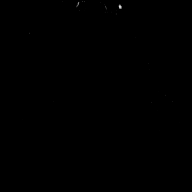
[im 71/212]
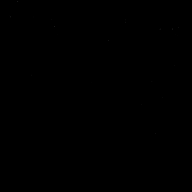
[im 141/212]
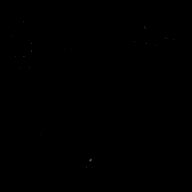
[im 212/212]
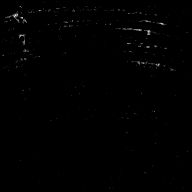

[Series 13: post t1_twist_tra_dyn-copy center · axial · non-contrast · 3.5mm · 0.83mm/px · z∈[-42,+39]mm · 14 of 720 slices shown (2 of 2)]
[im 1/720]
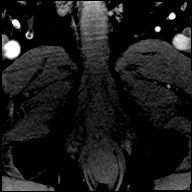
[im 56/720]
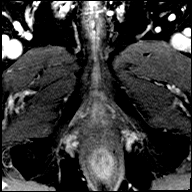
[im 111/720]
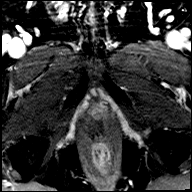
[im 166/720]
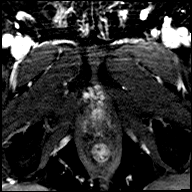
[im 222/720]
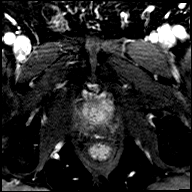
[im 277/720]
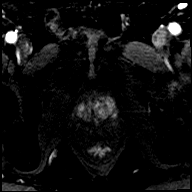
[im 332/720]
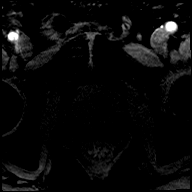
[im 388/720]
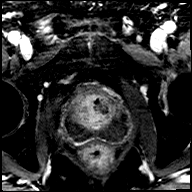
[im 443/720]
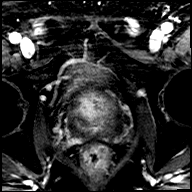
[im 498/720]
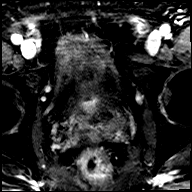
[im 554/720]
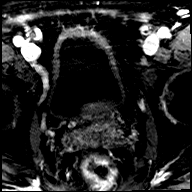
[im 609/720]
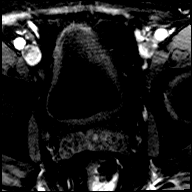
[im 664/720]
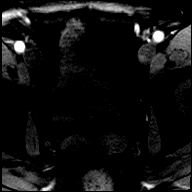
[im 720/720]
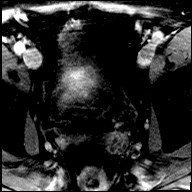

[Series 14: post t1_twist_tra_dyn-copy cent_sub · axial · 3.5mm · 0.83mm/px · z∈[-42,+39]mm · 14 of 695 slices shown (2 of 2)]
[im 1/695]
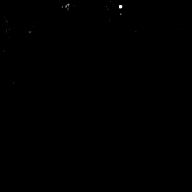
[im 54/695]
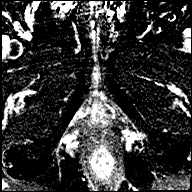
[im 107/695]
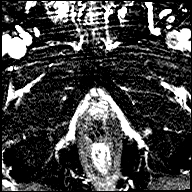
[im 161/695]
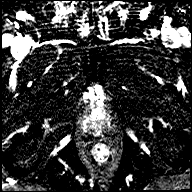
[im 214/695]
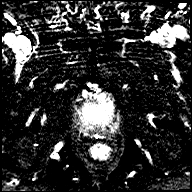
[im 267/695]
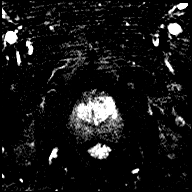
[im 321/695]
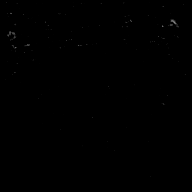
[im 374/695]
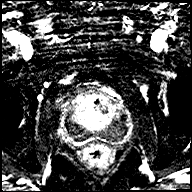
[im 428/695]
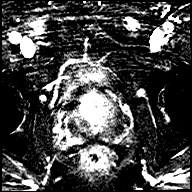
[im 481/695]
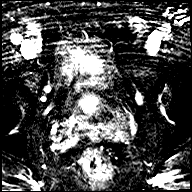
[im 534/695]
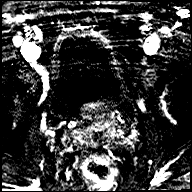
[im 588/695]
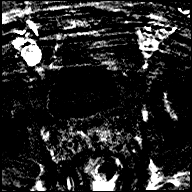
[im 641/695]
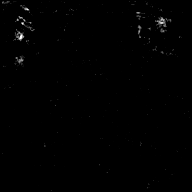
[im 695/695]
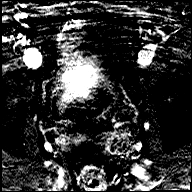

[Series 15: t1_vibe_dixon_tra_f · axial · 2.5mm · 0.91mm/px · z∈[-87,+111]mm · 2 of 80 slices shown]
[im 1/80]
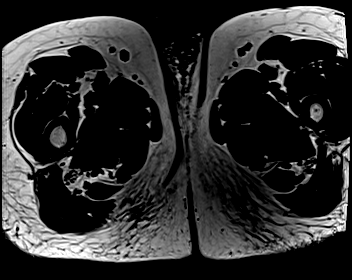
[im 80/80]
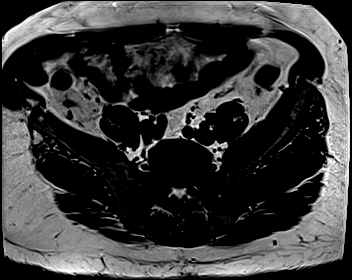

[Series 16: t1_vibe_dixon_tra_w · axial · 2.5mm · 0.91mm/px · z∈[-87,+111]mm · 2 of 80 slices shown]
[im 1/80]
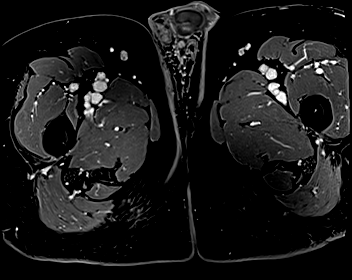
[im 80/80]
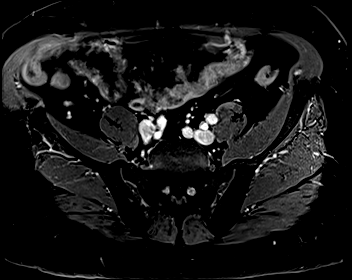

[48 of 48 positions shown; findings below may reference images not displayed]

FINDINGS: Prostate: Demonstrates mild central gland enlargement and
heterogeneity, consistent with benign prostatic hyperplasia. No
dominant central gland nodule.

Within the medial right mid to apical peripheral zone, a 3 mm focus
of T2 hypointensity on [DATE], 45/9, and [DATE] corresponds to decreased
signal on ADC map image [DATE]. No definite hyperintensity on long B
value diffusion-weighted imaging. PI-RADS(v2.1)-3.

No masslike early post-contrast enhancement within the prostate.
There is heterogeneous relatively diffuse peripheral zone
hyperenhancement which is nonspecific.

Volume: 5.1 x 4.2 x 5.5 cm (volume = 62 cm^3)

Transcapsular spread:  Absent

Seminal vesicle involvement: Absent

Neurovascular bundle involvement: Absent

Pelvic adenopathy: Absent

Bone metastasis: Absent

Other findings: Normal urinary bladder.  No significant free fluid.
IMPRESSION: 1. 3 mm multiparametric signal abnormality within the medial right
mid to apical peripheral zone. Equivocal for small volume higher
grade disease. PI-RADS(v2.1)-3.
2. No evidence of locally advanced or pelvic metastatic disease.

## 2020-07-03 MED ORDER — GADOBENATE DIMEGLUMINE 529 MG/ML IV SOLN
20.0000 mL | Freq: Once | INTRAVENOUS | Status: AC | PRN
  Administered 2020-07-03: 20 mL via INTRAVENOUS

## 2020-10-31 DIAGNOSIS — N1831 Chronic kidney disease, stage 3a: Secondary | ICD-10-CM | POA: Diagnosis not present

## 2020-10-31 DIAGNOSIS — Z1389 Encounter for screening for other disorder: Secondary | ICD-10-CM | POA: Diagnosis not present

## 2020-10-31 DIAGNOSIS — E7800 Pure hypercholesterolemia, unspecified: Secondary | ICD-10-CM | POA: Diagnosis not present

## 2020-10-31 DIAGNOSIS — Z0000 Encounter for general adult medical examination without abnormal findings: Secondary | ICD-10-CM | POA: Diagnosis not present

## 2020-10-31 DIAGNOSIS — E1122 Type 2 diabetes mellitus with diabetic chronic kidney disease: Secondary | ICD-10-CM | POA: Diagnosis not present

## 2020-10-31 DIAGNOSIS — I739 Peripheral vascular disease, unspecified: Secondary | ICD-10-CM | POA: Diagnosis not present

## 2020-10-31 DIAGNOSIS — I129 Hypertensive chronic kidney disease with stage 1 through stage 4 chronic kidney disease, or unspecified chronic kidney disease: Secondary | ICD-10-CM | POA: Diagnosis not present

## 2020-10-31 DIAGNOSIS — C61 Malignant neoplasm of prostate: Secondary | ICD-10-CM | POA: Diagnosis not present

## 2020-10-31 DIAGNOSIS — M109 Gout, unspecified: Secondary | ICD-10-CM | POA: Diagnosis not present

## 2020-11-25 DIAGNOSIS — C61 Malignant neoplasm of prostate: Secondary | ICD-10-CM | POA: Diagnosis not present

## 2020-12-31 DIAGNOSIS — B070 Plantar wart: Secondary | ICD-10-CM | POA: Diagnosis not present

## 2021-02-06 DIAGNOSIS — N289 Disorder of kidney and ureter, unspecified: Secondary | ICD-10-CM | POA: Diagnosis not present

## 2021-03-19 DIAGNOSIS — E1151 Type 2 diabetes mellitus with diabetic peripheral angiopathy without gangrene: Secondary | ICD-10-CM | POA: Diagnosis not present

## 2021-03-19 DIAGNOSIS — Z7982 Long term (current) use of aspirin: Secondary | ICD-10-CM | POA: Diagnosis not present

## 2021-03-19 DIAGNOSIS — E785 Hyperlipidemia, unspecified: Secondary | ICD-10-CM | POA: Diagnosis not present

## 2021-03-19 DIAGNOSIS — I10 Essential (primary) hypertension: Secondary | ICD-10-CM | POA: Diagnosis not present

## 2021-03-19 DIAGNOSIS — Z7984 Long term (current) use of oral hypoglycemic drugs: Secondary | ICD-10-CM | POA: Diagnosis not present

## 2021-05-12 DIAGNOSIS — E7800 Pure hypercholesterolemia, unspecified: Secondary | ICD-10-CM | POA: Diagnosis not present

## 2021-05-12 DIAGNOSIS — I739 Peripheral vascular disease, unspecified: Secondary | ICD-10-CM | POA: Diagnosis not present

## 2021-05-12 DIAGNOSIS — C61 Malignant neoplasm of prostate: Secondary | ICD-10-CM | POA: Diagnosis not present

## 2021-05-12 DIAGNOSIS — N1831 Chronic kidney disease, stage 3a: Secondary | ICD-10-CM | POA: Diagnosis not present

## 2021-05-12 DIAGNOSIS — M109 Gout, unspecified: Secondary | ICD-10-CM | POA: Diagnosis not present

## 2021-05-12 DIAGNOSIS — Z23 Encounter for immunization: Secondary | ICD-10-CM | POA: Diagnosis not present

## 2021-05-12 DIAGNOSIS — Z6831 Body mass index (BMI) 31.0-31.9, adult: Secondary | ICD-10-CM | POA: Diagnosis not present

## 2021-05-12 DIAGNOSIS — E1122 Type 2 diabetes mellitus with diabetic chronic kidney disease: Secondary | ICD-10-CM | POA: Diagnosis not present

## 2021-05-12 DIAGNOSIS — I129 Hypertensive chronic kidney disease with stage 1 through stage 4 chronic kidney disease, or unspecified chronic kidney disease: Secondary | ICD-10-CM | POA: Diagnosis not present

## 2021-05-20 DIAGNOSIS — C61 Malignant neoplasm of prostate: Secondary | ICD-10-CM | POA: Diagnosis not present

## 2021-05-27 DIAGNOSIS — N400 Enlarged prostate without lower urinary tract symptoms: Secondary | ICD-10-CM | POA: Diagnosis not present

## 2021-05-27 DIAGNOSIS — C61 Malignant neoplasm of prostate: Secondary | ICD-10-CM | POA: Diagnosis not present

## 2021-05-28 ENCOUNTER — Other Ambulatory Visit: Payer: Self-pay | Admitting: Urology

## 2021-05-28 DIAGNOSIS — C61 Malignant neoplasm of prostate: Secondary | ICD-10-CM

## 2021-06-27 ENCOUNTER — Other Ambulatory Visit: Payer: Medicare PPO

## 2021-06-28 ENCOUNTER — Ambulatory Visit
Admission: RE | Admit: 2021-06-28 | Discharge: 2021-06-28 | Disposition: A | Discharge status: Home / Self Care | Payer: Medicare PPO | Source: Ambulatory Visit | Attending: Urology | Admitting: Urology

## 2021-06-28 ENCOUNTER — Other Ambulatory Visit: Payer: Self-pay

## 2021-06-28 DIAGNOSIS — C61 Malignant neoplasm of prostate: Secondary | ICD-10-CM

## 2021-06-28 DIAGNOSIS — N401 Enlarged prostate with lower urinary tract symptoms: Secondary | ICD-10-CM | POA: Diagnosis not present

## 2021-06-28 IMAGING — MR MR PROSTATE WO/W CM
12 series · 48 of 48 positions shown · IV contrast (multihance)
Comparison: None.

CLINICAL DATA: Elevated PSA level of 5.86. Prior biopsy [DATE]
showed atypical glands in the right lateral mid gland and high-grade
PIN in the right lateral apex.

EXAM:
MR PROSTATE WITHOUT AND WITH CONTRAST
TECHNIQUE: Multiplanar multisequence MRI images were obtained of the pelvis
centered about the prostate. Pre and post contrast images were
obtained.
CONTRAST:  20mL MULTIHANCE GADOBENATE DIMEGLUMINE 529 MG/ML IV SOLN

[Series 3: T2 · coronal · 3.0mm · 0.56mm/px · 1 of 23 slices shown (1 of 3)]
[im 1/23]
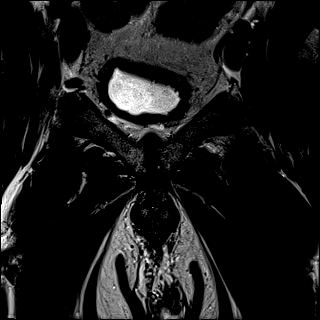

[Series 4: T1 · axial · 5.0mm · 1.25mm/px · z∈[+6,+221]mm · 2 of 88 slices shown]
[im 1/88]
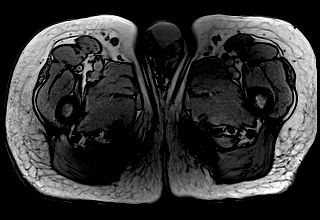
[im 88/88]
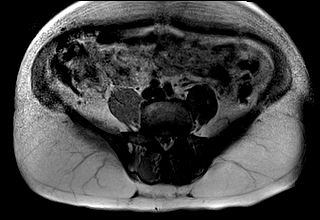

[Series 5: DWI · axial · 3.0mm · 1.75mm/px · 1 of 72 slices shown (1 of 3)]
[im 1/72]
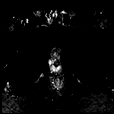

[Series 6: DWI · axial · 3.0mm · 1.75mm/px · 1 of 24 slices shown (2 of 3)]
[im 1/24]
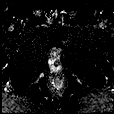

[Series 7: DWI · axial · 3.0mm · 1.75mm/px · 1 of 24 slices shown (3 of 3)]
[im 1/24]
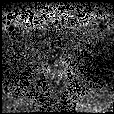

[Series 8: T2 · axial · 3.0mm · 0.56mm/px · 1 of 24 slices shown (2 of 3)]
[im 1/24]
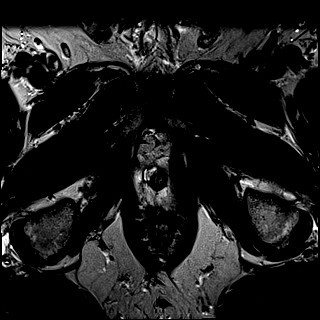

[Series 9: T2 · axial · 1.0mm · 1.04mm/px · z∈[+54,+125]mm · 2 of 72 slices shown (3 of 3)]
[im 1/72]
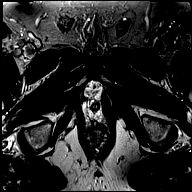
[im 72/72]
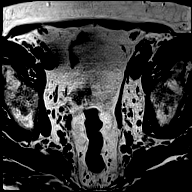

[Series 10: pre t1_twist_tra_dyn · axial · non-contrast · 3.5mm · 0.83mm/px · 1 of 20 slices shown]
[im 1/20]
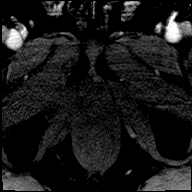

[Series 11: post t1_twist_tra_dyn-copy center · axial · non-contrast · 3.5mm · 0.83mm/px · z∈[+56,+123]mm · 17 of 600 slices shown]
[im 1/600]
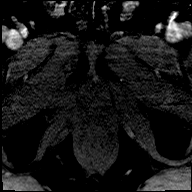
[im 38/600]
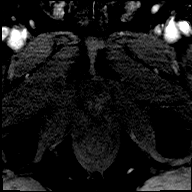
[im 75/600]
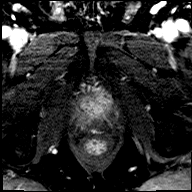
[im 113/600]
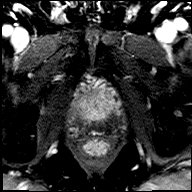
[im 150/600]
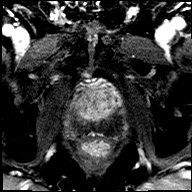
[im 188/600]
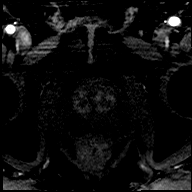
[im 225/600]
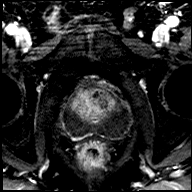
[im 263/600]
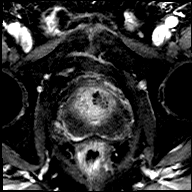
[im 300/600]
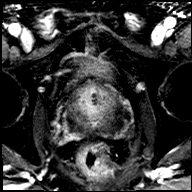
[im 337/600]
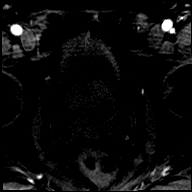
[im 375/600]
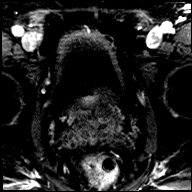
[im 412/600]
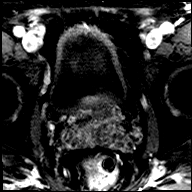
[im 450/600]
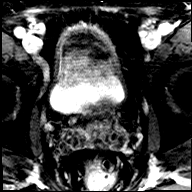
[im 487/600]
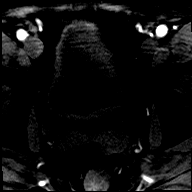
[im 525/600]
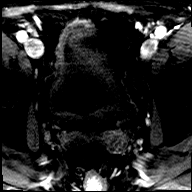
[im 562/600]
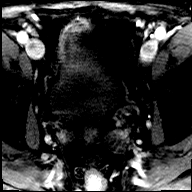
[im 600/600]
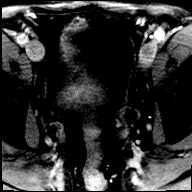

[Series 12: post t1_twist_tra_dyn-copy cent_sub · axial · 3.5mm · 0.83mm/px · z∈[+56,+123]mm · 17 of 580 slices shown]
[im 1/580]
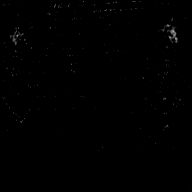
[im 37/580]
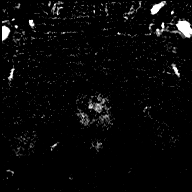
[im 73/580]
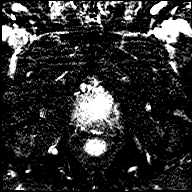
[im 109/580]
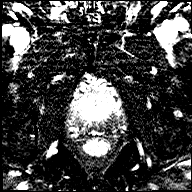
[im 145/580]
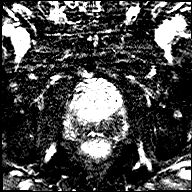
[im 181/580]
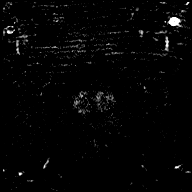
[im 218/580]
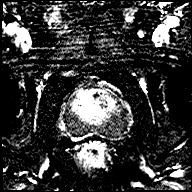
[im 254/580]
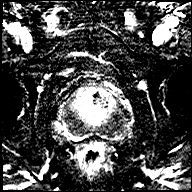
[im 290/580]
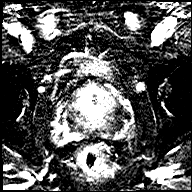
[im 326/580]
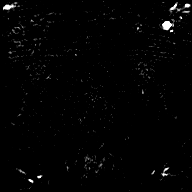
[im 362/580]
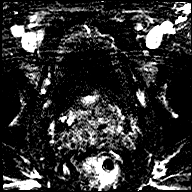
[im 399/580]
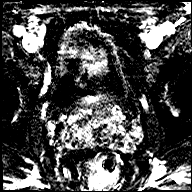
[im 435/580]
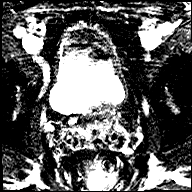
[im 471/580]
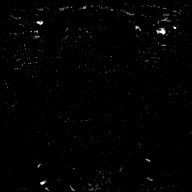
[im 507/580]
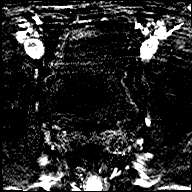
[im 543/580]
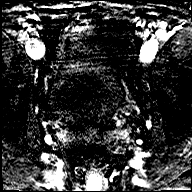
[im 580/580]
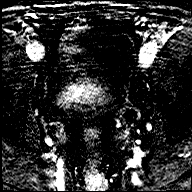

[Series 13: t1_vibe_dixon_tra_f · axial · 2.5mm · 0.91mm/px · z∈[+15,+213]mm · 2 of 80 slices shown]
[im 1/80]
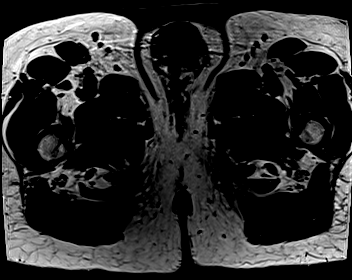
[im 80/80]
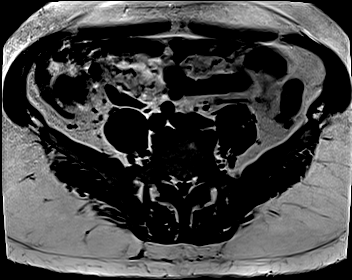

[Series 14: t1_vibe_dixon_tra_w · axial · 2.5mm · 0.91mm/px · z∈[+15,+213]mm · 2 of 80 slices shown]
[im 1/80]
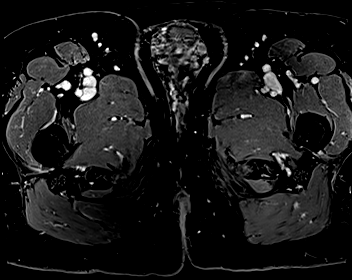
[im 80/80]
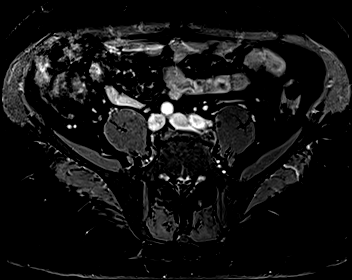

[48 of 48 positions shown; findings below may reference images not displayed]

FINDINGS: Prostate:

Region of interest # 1: Small PI-RADS category 4 lesion of the left
anterior peripheral zone in mid gland and apex, with reduced T2
signal (image 49, series 9) and focal early enhancement (image 175,
series 12). This measures 0.48 cc (1.3 by 0.5 by 1.1 cm).

Region of interest # 2: Very small PI-RADS category 3 lesion of the
right posteromedial peripheral zone at the apex, with focally
reduced T2 signal (image 20, series 8) but no significant early
enhancement. This measures 0.06 cc (0.5 by 0.4 by 0.3 cm).

There is some indistinct low T2 signal stranding in the prostate
gland which is likely postinflammatory and considered PI-RADS
category 2.

Mild encapsulated nodularity in the transition zone compatible with
benign prostatic hypertrophy.

Volume: 3D volumetric analysis: Prostate volume 64.12 cc (6.0 by
by 5.0 cm).

Transcapsular spread:  Absent

Seminal vesicle involvement: Absent

Neurovascular bundle involvement: Absent

Pelvic adenopathy: Absent

Bone metastasis: Absent

Other findings: Sigmoid colon diverticulosis.
IMPRESSION: 1. Small PI-RADS category 4 lesion in the left peripheral zone and
very small PI-RADS category 3 lesion in the right peripheral zone.
Targeting data sent to [facility].
2. Mild to moderate prostatomegaly with mild benign prostatic
hypertrophy.
3. Sigmoid colon diverticulosis.

## 2021-06-28 MED ORDER — GADOBENATE DIMEGLUMINE 529 MG/ML IV SOLN
20.0000 mL | Freq: Once | INTRAVENOUS | Status: AC | PRN
  Administered 2021-06-28: 20 mL via INTRAVENOUS

## 2021-08-04 DIAGNOSIS — D075 Carcinoma in situ of prostate: Secondary | ICD-10-CM | POA: Diagnosis not present

## 2021-08-04 DIAGNOSIS — C61 Malignant neoplasm of prostate: Secondary | ICD-10-CM | POA: Diagnosis not present

## 2021-08-14 DIAGNOSIS — C61 Malignant neoplasm of prostate: Secondary | ICD-10-CM | POA: Diagnosis not present

## 2021-08-20 NOTE — Progress Notes (Signed)
GU Location of Tumor / Histology: Prostate Ca ? ?If Prostate Cancer, Gleason Score is (3 + 3) and PSA is (5.86 as of 05/2021) ? ?Biopsies:  ?Dr. Lovena Neighbours: ? ? ? ? ? ?Past/Anticipated interventions by urology, if any:  ? ? ? ?Past/Anticipated interventions by medical oncology, if any:  NA ? ?Weight changes, if any: No ? ?IPSS:  8 ?SHIM:  13 ? ?Bowel/Bladder complaints, if any:  No bowel or bladder issues. ? ?Nausea/Vomiting, if any: No ? ?Pain issues, if any:  0/10 ? ?SAFETY ISSUES: ?Prior radiation?  No ?Pacemaker/ICD? No ?Possible current pregnancy? Male ?Is the patient on methotrexate?No ? ?Current Complaints / other details:   ?

## 2021-08-26 ENCOUNTER — Ambulatory Visit
Admission: RE | Admit: 2021-08-26 | Discharge: 2021-08-26 | Disposition: A | Discharge status: Home / Self Care | Payer: Medicare PPO | Source: Ambulatory Visit | Attending: Radiation Oncology | Admitting: Radiation Oncology

## 2021-08-26 ENCOUNTER — Other Ambulatory Visit: Payer: Self-pay

## 2021-08-26 ENCOUNTER — Encounter: Payer: Self-pay | Admitting: Radiation Oncology

## 2021-08-26 VITALS — BP 184/98 | HR 89 | Temp 97.8°F | Resp 20 | Ht 72.0 in | Wt 222.2 lb

## 2021-08-26 DIAGNOSIS — Z79899 Other long term (current) drug therapy: Secondary | ICD-10-CM | POA: Insufficient documentation

## 2021-08-26 DIAGNOSIS — C61 Malignant neoplasm of prostate: Secondary | ICD-10-CM | POA: Insufficient documentation

## 2021-08-26 DIAGNOSIS — I129 Hypertensive chronic kidney disease with stage 1 through stage 4 chronic kidney disease, or unspecified chronic kidney disease: Secondary | ICD-10-CM | POA: Insufficient documentation

## 2021-08-26 DIAGNOSIS — Z7982 Long term (current) use of aspirin: Secondary | ICD-10-CM | POA: Diagnosis not present

## 2021-08-26 DIAGNOSIS — Z7984 Long term (current) use of oral hypoglycemic drugs: Secondary | ICD-10-CM | POA: Insufficient documentation

## 2021-08-26 DIAGNOSIS — I10 Essential (primary) hypertension: Secondary | ICD-10-CM | POA: Insufficient documentation

## 2021-08-26 DIAGNOSIS — I739 Peripheral vascular disease, unspecified: Secondary | ICD-10-CM | POA: Insufficient documentation

## 2021-08-26 DIAGNOSIS — M109 Gout, unspecified: Secondary | ICD-10-CM | POA: Insufficient documentation

## 2021-08-26 DIAGNOSIS — Z191 Hormone sensitive malignancy status: Secondary | ICD-10-CM | POA: Diagnosis not present

## 2021-08-26 DIAGNOSIS — B070 Plantar wart: Secondary | ICD-10-CM | POA: Insufficient documentation

## 2021-08-26 DIAGNOSIS — E7800 Pure hypercholesterolemia, unspecified: Secondary | ICD-10-CM | POA: Insufficient documentation

## 2021-08-26 DIAGNOSIS — E785 Hyperlipidemia, unspecified: Secondary | ICD-10-CM | POA: Insufficient documentation

## 2021-08-26 DIAGNOSIS — E119 Type 2 diabetes mellitus without complications: Secondary | ICD-10-CM | POA: Diagnosis not present

## 2021-08-26 DIAGNOSIS — E1121 Type 2 diabetes mellitus with diabetic nephropathy: Secondary | ICD-10-CM | POA: Insufficient documentation

## 2021-08-26 DIAGNOSIS — N1831 Chronic kidney disease, stage 3a: Secondary | ICD-10-CM | POA: Insufficient documentation

## 2021-08-26 DIAGNOSIS — Z6830 Body mass index (BMI) 30.0-30.9, adult: Secondary | ICD-10-CM | POA: Insufficient documentation

## 2021-08-26 NOTE — Progress Notes (Addendum)
?Radiation Oncology         [phone] ?________________________________ ? ?Initial Outpatient Consultation ? ?Name: Garrett Snyder MRN: [id number]  ?Date: 08/26/2021  DOB: 1950/08/31 ? ?AS:NKNLZ, Herbie Baltimore, MD  [name]*  ? ?REFERRING PHYSICIAN: Winter, Christopher Aar* ? ?DIAGNOSIS: 71 y.o. gentleman with Stage T1c adenocarcinoma of the prostate with Gleason score of 3+3, and PSA of 5.86. ? ?  ICD-10-CM   ?1. Malignant neoplasm of prostate ([place])  C61   ?  ? ? ?HISTORY OF PRESENT ILLNESS: Garrett Snyder is a 71 y.o. male with a diagnosis of prostate cancer. He was initially referred to Dr. Pilar Jarvis in 10/2016 for an elevated PSA of 9.23 but had been as high as 10.12 in 08/2016. He subsequently underwent prostate biopsy on 10/26/16 which showed Gleason 3+3 prostate cancer in 5% of one core. He has been on active surveillance since that time. His PSA inexplicably dropped to 7.67 in 02/2017. He underwent repeat confirmatory biopsy on 02/15/17, again showing low volume Gleason 3+3 disease. He continued in active surveillance and his PSA remained relatively stable in the 3's. A surveillance prostate biopsy was performed 10/24/18 and showed only atypia. More recently, he has been followed by Dr. Lovena Neighbours. His PSA began gradually rising at 4.42 in 11/2019 and 4.88 in 05/2020. Therefore, a prostate MRI was performed on 07/03/20 and showed only a 3 mm PI-RADS 3 in the medial right mid-apex peripheral zone.  ? ?He continued in active surveillance with gradual rise in the PSA which reached 5.86 in 05/2021.  He underwent a repeat surveillance prostate MRI on 06/28/21 which showed a small PI-RADS 4 lesion in the left peripheral zone and a very small PI-RADS 3 lesion in the right peripheral zone. The patient proceeded to MRI fusion biopsy of the prostate on 08/04/21.  The prostate volume measured 67 cc.  Out of 18 core biopsies, 4 were positive.  The maximum Gleason score was 3+3, and this was seen in the left apex lateral (two  foci), left mid lateral (small focus), 1 of 2 samples from MRI ROI #2 (on the right), and 1 of 4 samples from MRI ROI #1 (on the left). ? ?The patient reviewed the biopsy results with his urologist and he has kindly been referred today for discussion of potential radiation treatment options. ? ? ?PREVIOUS RADIATION THERAPY: No ? ?PAST MEDICAL HISTORY:  ?Past Medical History:  ?Diagnosis Date  ? Diabetes mellitus without complication ([place])   ? Hyperlipidemia   ? Hypertension   ?   ? ?PAST SURGICAL HISTORY: ?Past Surgical History:  ?Procedure Laterality Date  ? NO PAST SURGERIES    ? ? ?FAMILY HISTORY:  ?Family History  ?Family history unknown: Yes  ? ? ?SOCIAL HISTORY:  ?Social History  ? ?Socioeconomic History  ? Marital status: Married  ?  Spouse name: Not on file  ? Number of children: Not on file  ? Years of education: Not on file  ? Highest education level: Not on file  ?Occupational History  ? Not on file  ?Tobacco Use  ? Smoking status: Never  ? Smokeless tobacco: Never  ?Substance and Sexual Activity  ? Alcohol use: No  ? Drug use: No  ? Sexual activity: Not on file  ?Other Topics Concern  ? Not on file  ?Social History Narrative  ? Not on file  ? ?Social Determinants of Health  ? ?Financial Resource Strain: Not on file  ?Food Insecurity: Not on file  ?Transportation Needs: Not  on file  ?Physical Activity: Not on file  ?Stress: Not on file  ?Social Connections: Not on file  ?Intimate Partner Violence: Not on file  ? ? ?ALLERGIES: Patient has no known allergies. ? ?MEDICATIONS:  ?Current Outpatient Medications  ?Medication Sig Dispense Refill  ? allopurinol (ZYLOPRIM) 300 MG tablet Take 300 mg by mouth daily.     ? aspirin 81 MG tablet Take 81 mg by mouth daily.     ? losartan-hydrochlorothiazide (HYZAAR) 100-12.5 MG tablet Take 1 tablet by mouth every morning.    ? metFORMIN (GLUCOPHAGE) 1000 MG tablet Take 1,000 mg by mouth 2 (two) times daily with a meal.    ? simvastatin (ZOCOR) 40 MG tablet Take 40 mg by  mouth every evening.    ? sitaGLIPtin (JANUVIA) 100 MG tablet Take 100 mg by mouth daily.    ? ?No current facility-administered medications for this encounter.  ? ? ?REVIEW OF SYSTEMS:  On review of systems, the patient reports that he is doing well overall. He denies any chest pain, shortness of breath, cough, fevers, chills, night sweats, unintended weight changes. He denies any bowel disturbances, and denies abdominal pain, nausea or vomiting. He denies any new musculoskeletal or joint aches or pains. His IPSS was 8, indicating mild urinary symptoms with incomplete bladder emptying but no frequency or urgency and only very mild weak flow and nocturia x1. His SHIM was 13, indicating he has moderate erectile dysfunction. A complete review of systems is obtained and is otherwise negative. ? ?PHYSICAL EXAM:  ?Wt Readings from Last 3 Encounters:  ?08/26/21 222 lb 3.2 oz (100.8 kg)  ?08/16/18 218 lb (98.9 kg)  ? ?Temp Readings from Last 3 Encounters:  ?08/26/21 97.8 ?F (36.6 ?C) (Oral)  ?08/17/18 98.7 ?F (37.1 ?C) (Oral)  ?10/28/15 98.1 ?F (36.7 ?C) (Oral)  ? ?BP Readings from Last 3 Encounters:  ?08/26/21 (!) 184/98  ?08/17/18 96/68  ?10/28/15 136/89  ? ?Pulse Readings from Last 3 Encounters:  ?08/26/21 89  ?08/17/18 (!) 101  ?10/28/15 98  ? ?Pain Assessment ?Pain Score: 0-No pain/10 ? ?In general this is a well appearing African American male in no acute distress. He's alert and oriented x4 and appropriate throughout the examination. Cardiopulmonary assessment is negative for acute distress, and he exhibits normal effort.   ? ? ?KPS = 100 ? ?100 - Normal; no complaints; no evidence of disease. ?90   - Able to carry on normal activity; minor signs or symptoms of disease. ?80   - Normal activity with effort; some signs or symptoms of disease. ?63   - Cares for self; unable to carry on normal activity or to do active work. ?60   - Requires occasional assistance, but is able to care for most of his personal needs. ?50    - Requires considerable assistance and frequent medical care. ?100   - Disabled; requires special care and assistance. ?30   - Severely disabled; hospital admission is indicated although death not imminent. ?20   - Very sick; hospital admission necessary; active supportive treatment necessary. ?10   - Moribund; fatal processes progressing rapidly. ?0     - Dead ? ?Karnofsky DA, Abelmann WH, Craver LS and Burchenal [facility] [phone]) The use of the nitrogen mustards in the palliative treatment of carcinoma: with particular reference to bronchogenic carcinoma Cancer 1 [phone] ? ?LABORATORY DATA:  ?Lab Results  ?Component Value Date  ? WBC 8.6 08/16/2018  ? HGB 15.1 08/16/2018  ? HCT 49.7 08/16/2018  ?  MCV 87.8 08/16/2018  ? PLT 137 (L) 08/16/2018  ? ?Lab Results  ?Component Value Date  ? NA 139 08/16/2018  ? K 4.4 08/16/2018  ? CL 104 08/16/2018  ? CO2 26 08/16/2018  ? ?Lab Results  ?Component Value Date  ? ALT 28 06/12/2008  ? AST 28 06/12/2008  ? ALKPHOS 76 06/12/2008  ? BILITOT 1.4 (H) 06/12/2008  ? ?  ?RADIOGRAPHY: No results found. ?   ?IMPRESSION/PLAN: ?1. 71 y.o. gentleman with Stage T1c adenocarcinoma of the prostate with Gleason Score of 3+3, and PSA of 5.86. ?We discussed the patient's workup and outlined the nature of prostate cancer in this setting. The patient's T stage, Gleason's score, and PSA put him into the favorable low risk group although there is now evidence of some disease progression on recent biopsy. Accordingly, he is eligible for a variety of potential treatment options including continued active surveillance, brachytherapy, 5.5 weeks of external radiation, or prostatectomy. We discussed the available radiation techniques, and focused on the details and logistics of delivery. We discussed and outlined the risks, benefits, short and long-term effects associated with radiotherapy and compared and contrasted these with prostatectomy. We discussed the role of SpaceOAR gel in reducing the rectal toxicity  associated with radiotherapy. He was encouraged to ask questions that were answered to his stated satisfaction. ? ?At the conclusion of our conversation, the patient is interested in moving forward with

## 2021-08-26 NOTE — Progress Notes (Signed)
Introduced myself to the patient as the prostate nurse navigator.  No barriers to care identified at this time.  He is here to discuss his radiation treatment options. Patient verbalized he will be going on a vacation in April, and would like to move forward with treatment after that time.  I gave him my business card and asked him to call me with questions or concerns.  Verbalized understanding.  ?

## 2021-08-29 ENCOUNTER — Other Ambulatory Visit: Payer: Self-pay | Admitting: Urology

## 2021-09-08 ENCOUNTER — Telehealth: Payer: Self-pay | Admitting: *Deleted

## 2021-09-08 NOTE — Telephone Encounter (Signed)
Returned patient's phone call, spoke with patient 

## 2021-09-24 DIAGNOSIS — C61 Malignant neoplasm of prostate: Secondary | ICD-10-CM | POA: Diagnosis not present

## 2021-10-01 ENCOUNTER — Encounter (HOSPITAL_BASED_OUTPATIENT_CLINIC_OR_DEPARTMENT_OTHER): Payer: Self-pay | Admitting: Urology

## 2021-10-02 ENCOUNTER — Other Ambulatory Visit: Payer: Self-pay

## 2021-10-02 ENCOUNTER — Encounter (HOSPITAL_BASED_OUTPATIENT_CLINIC_OR_DEPARTMENT_OTHER): Payer: Self-pay | Admitting: Urology

## 2021-10-02 DIAGNOSIS — E119 Type 2 diabetes mellitus without complications: Secondary | ICD-10-CM

## 2021-10-02 DIAGNOSIS — Z972 Presence of dental prosthetic device (complete) (partial): Secondary | ICD-10-CM

## 2021-10-02 DIAGNOSIS — N189 Chronic kidney disease, unspecified: Secondary | ICD-10-CM

## 2021-10-02 HISTORY — DX: Type 2 diabetes mellitus without complications: E11.9

## 2021-10-02 HISTORY — DX: Chronic kidney disease, unspecified: N18.9

## 2021-10-02 HISTORY — DX: Presence of dental prosthetic device (complete) (partial): Z97.2

## 2021-10-02 NOTE — Progress Notes (Signed)
Spoke w/ via phone for pre-op interview---pt ?Lab needs dos---ekg, istat               ?Lab results------ekg 08-17-2018 chart/epic ?COVID test -----patient states asymptomatic no test needed ?Arrive at -------1115 am 10-03-2021 ?NPO after MN NO Solid Food.  Clear liquids from MN until---1015 an ?Med rec completed ?Medications to take morning of surgery -----Allopurinol, no losartan/hctz dos,  ?Diabetic medication -----none dos ?Patient instructed no nail polish to be worn day of surgery ?Patient instructed to bring photo id and insurance card day of surgery ?Patient aware to have Driver (ride ) / caregiver wife holsworth will stay cell [phone]    for 24 hours after surgery  ?Patient Special Instructions -----fleets enema am of surgery ?Pre-Op special Istructions -----pt stopped 81 mg asa 09-25-2021 per pt ?Patient verbalized understanding of instructions that were given at this phone interview. ?Patient denies shortness of breath, chest pain, fever, cough at this phone interview.  ?

## 2021-10-03 ENCOUNTER — Ambulatory Visit (HOSPITAL_BASED_OUTPATIENT_CLINIC_OR_DEPARTMENT_OTHER)
Admission: RE | Admit: 2021-10-03 | Discharge: 2021-10-03 | Disposition: A | Discharge status: Home / Self Care | Payer: Medicare PPO | Attending: Urology | Admitting: Urology

## 2021-10-03 ENCOUNTER — Encounter (HOSPITAL_BASED_OUTPATIENT_CLINIC_OR_DEPARTMENT_OTHER): Payer: Self-pay | Admitting: Urology

## 2021-10-03 ENCOUNTER — Other Ambulatory Visit: Payer: Self-pay

## 2021-10-03 ENCOUNTER — Encounter (HOSPITAL_BASED_OUTPATIENT_CLINIC_OR_DEPARTMENT_OTHER)
Admission: RE | Disposition: A | Discharge status: Home / Self Care | Payer: Self-pay | Source: Home / Self Care | Attending: Urology

## 2021-10-03 ENCOUNTER — Ambulatory Visit (HOSPITAL_BASED_OUTPATIENT_CLINIC_OR_DEPARTMENT_OTHER): Payer: Medicare PPO | Admitting: Anesthesiology

## 2021-10-03 DIAGNOSIS — E1122 Type 2 diabetes mellitus with diabetic chronic kidney disease: Secondary | ICD-10-CM | POA: Insufficient documentation

## 2021-10-03 DIAGNOSIS — N189 Chronic kidney disease, unspecified: Secondary | ICD-10-CM | POA: Diagnosis not present

## 2021-10-03 DIAGNOSIS — M109 Gout, unspecified: Secondary | ICD-10-CM | POA: Insufficient documentation

## 2021-10-03 DIAGNOSIS — I129 Hypertensive chronic kidney disease with stage 1 through stage 4 chronic kidney disease, or unspecified chronic kidney disease: Secondary | ICD-10-CM | POA: Insufficient documentation

## 2021-10-03 DIAGNOSIS — Z79899 Other long term (current) drug therapy: Secondary | ICD-10-CM | POA: Diagnosis not present

## 2021-10-03 DIAGNOSIS — C61 Malignant neoplasm of prostate: Secondary | ICD-10-CM

## 2021-10-03 DIAGNOSIS — I739 Peripheral vascular disease, unspecified: Secondary | ICD-10-CM | POA: Diagnosis not present

## 2021-10-03 DIAGNOSIS — Z7984 Long term (current) use of oral hypoglycemic drugs: Secondary | ICD-10-CM | POA: Diagnosis not present

## 2021-10-03 DIAGNOSIS — M1990 Unspecified osteoarthritis, unspecified site: Secondary | ICD-10-CM | POA: Diagnosis not present

## 2021-10-03 HISTORY — PX: GOLD SEED IMPLANT: SHX6343

## 2021-10-03 HISTORY — DX: Malignant neoplasm of prostate: C61

## 2021-10-03 HISTORY — DX: Elevated prostate specific antigen (PSA): R97.20

## 2021-10-03 HISTORY — PX: SPACE OAR INSTILLATION: SHX6769

## 2021-10-03 HISTORY — DX: Personal history of other diseases of the musculoskeletal system and connective tissue: Z87.39

## 2021-10-03 HISTORY — DX: Chronic kidney disease, unspecified: N18.9

## 2021-10-03 LAB — GLUCOSE, CAPILLARY: Glucose-Capillary: 114 mg/dL — ABNORMAL HIGH (ref 70–99)

## 2021-10-03 LAB — POCT I-STAT, CHEM 8
BUN: 22 mg/dL (ref 8–23)
Calcium, Ion: 1.3 mmol/L (ref 1.15–1.40)
Chloride: 103 mmol/L (ref 98–111)
Creatinine, Ser: 1.4 mg/dL — ABNORMAL HIGH (ref 0.61–1.24)
Glucose, Bld: 106 mg/dL — ABNORMAL HIGH (ref 70–99)
HCT: 50 % (ref 39.0–52.0)
Hemoglobin: 17 g/dL (ref 13.0–17.0)
Potassium: 3.9 mmol/L (ref 3.5–5.1)
Sodium: 144 mmol/L (ref 135–145)
TCO2: 29 mmol/L (ref 22–32)

## 2021-10-03 SURGERY — INSERTION, GOLD SEEDS
Anesthesia: Monitor Anesthesia Care | Site: Prostate

## 2021-10-03 MED ORDER — ACETAMINOPHEN 10 MG/ML IV SOLN: 1000.0000 mg | Freq: Once | INTRAVENOUS | Status: DC | PRN

## 2021-10-03 MED ORDER — FENTANYL CITRATE (PF) 100 MCG/2ML IJ SOLN
INTRAMUSCULAR | Status: DC | PRN
  Administered 2021-10-03: 50 ug via INTRAVENOUS

## 2021-10-03 MED ORDER — ACETAMINOPHEN 500 MG PO TABS: 1000.0000 mg | ORAL_TABLET | Freq: Once | ORAL | Status: DC

## 2021-10-03 MED ORDER — ONDANSETRON HCL 4 MG/2ML IJ SOLN: 4.0000 mg | Freq: Once | INTRAMUSCULAR | Status: DC | PRN

## 2021-10-03 MED ORDER — SODIUM CHLORIDE (PF) 0.9 % IJ SOLN
INTRAMUSCULAR | Status: DC | PRN
  Administered 2021-10-03: 10 mL via INTRAVENOUS

## 2021-10-03 MED ORDER — CEFAZOLIN SODIUM-DEXTROSE 2-4 GM/100ML-% IV SOLN
INTRAVENOUS | Status: AC
Start: 2021-10-03 — End: ?
  Filled 2021-10-03: qty 100

## 2021-10-03 MED ORDER — FLEET ENEMA 7-19 GM/118ML RE ENEM: 1.0000 | ENEMA | Freq: Once | RECTAL | Status: DC

## 2021-10-03 MED ORDER — HYDRALAZINE HCL 20 MG/ML IJ SOLN
5.0000 mg | Freq: Once | INTRAMUSCULAR | Status: AC
  Administered 2021-10-03: 5 mg via INTRAVENOUS

## 2021-10-03 MED ORDER — ONDANSETRON HCL 4 MG/2ML IJ SOLN
INTRAMUSCULAR | Status: DC | PRN
  Administered 2021-10-03: 4 mg via INTRAVENOUS

## 2021-10-03 MED ORDER — AMISULPRIDE (ANTIEMETIC) 5 MG/2ML IV SOLN: 10.0000 mg | Freq: Once | INTRAVENOUS | Status: DC | PRN

## 2021-10-03 MED ORDER — SODIUM CHLORIDE 0.9 % IV SOLN: INTRAVENOUS | Status: DC

## 2021-10-03 MED ORDER — MIDAZOLAM HCL 5 MG/5ML IJ SOLN
INTRAMUSCULAR | Status: DC | PRN
  Administered 2021-10-03: 1 mg via INTRAVENOUS

## 2021-10-03 MED ORDER — FENTANYL CITRATE (PF) 250 MCG/5ML IJ SOLN: INTRAMUSCULAR | Status: DC | PRN

## 2021-10-03 MED ORDER — LACTATED RINGERS IV SOLN: INTRAVENOUS | Status: DC | PRN

## 2021-10-03 MED ORDER — HYDRALAZINE HCL 20 MG/ML IJ SOLN
INTRAMUSCULAR | Status: AC
  Filled 2021-10-03: qty 1

## 2021-10-03 MED ORDER — PROPOFOL 500 MG/50ML IV EMUL
INTRAVENOUS | Status: DC | PRN
  Administered 2021-10-03: 50 ug/kg/min via INTRAVENOUS

## 2021-10-03 MED ORDER — ONDANSETRON HCL 4 MG/2ML IJ SOLN
INTRAMUSCULAR | Status: AC
  Filled 2021-10-03: qty 2

## 2021-10-03 MED ORDER — FENTANYL CITRATE (PF) 100 MCG/2ML IJ SOLN
INTRAMUSCULAR | Status: AC
  Filled 2021-10-03: qty 2

## 2021-10-03 MED ORDER — PROPOFOL 10 MG/ML IV BOLUS
INTRAVENOUS | Status: DC | PRN
  Administered 2021-10-03: 10 mg via INTRAVENOUS
  Administered 2021-10-03 (×2): 20 mg via INTRAVENOUS

## 2021-10-03 MED ORDER — CEFAZOLIN SODIUM-DEXTROSE 2-4 GM/100ML-% IV SOLN
2.0000 g | Freq: Once | INTRAVENOUS | Status: AC
  Administered 2021-10-03: 2 g via INTRAVENOUS

## 2021-10-03 MED ORDER — FENTANYL CITRATE (PF) 100 MCG/2ML IJ SOLN: 25.0000 ug | INTRAMUSCULAR | Status: DC | PRN

## 2021-10-03 MED ORDER — MIDAZOLAM HCL 2 MG/2ML IJ SOLN
INTRAMUSCULAR | Status: AC
  Filled 2021-10-03: qty 2

## 2021-10-03 SURGICAL SUPPLY — 28 items
BAND INSRT 18 STRL LF DISP RB (MISCELLANEOUS) ×1
BAND RUBBER #18 3X1/16 STRL (MISCELLANEOUS) ×1 IMPLANT
BLADE CLIPPER SENSICLIP SURGIC (BLADE) ×2 IMPLANT
CNTNR URN SCR LID CUP LEK RST (MISCELLANEOUS) ×1 IMPLANT
CONT SPEC 4OZ STRL OR WHT (MISCELLANEOUS) ×2
COVER BACK TABLE 60X90IN (DRAPES) ×2 IMPLANT
DRSG IV TEGADERM 3.5X4.5 STRL (GAUZE/BANDAGES/DRESSINGS) ×1 IMPLANT
DRSG TEGADERM 4X4.75 (GAUZE/BANDAGES/DRESSINGS) ×2 IMPLANT
DRSG TEGADERM 8X12 (GAUZE/BANDAGES/DRESSINGS) ×2 IMPLANT
GAUZE SPONGE 4X4 12PLY STRL (GAUZE/BANDAGES/DRESSINGS) ×2 IMPLANT
GAUZE SPONGE 4X4 8PLY NS (GAUZE/BANDAGES/DRESSINGS) ×1 IMPLANT
GLOVE BIO SURGEON STRL SZ7.5 (GLOVE) ×2 IMPLANT
GLOVE SURG ORTHO 8.5 STRL (GLOVE) ×2 IMPLANT
IMPL SPACEOAR VUE SYSTEM (Spacer) ×1 IMPLANT
IMPLANT SPACEOAR VUE SYSTEM (Spacer) ×2 IMPLANT
KIT TURNOVER CYSTO (KITS) ×2 IMPLANT
MARKER GOLD PRELOAD 1.2X3 (Urological Implant) ×1 IMPLANT
MARKER SKIN DUAL TIP RULER LAB (MISCELLANEOUS) ×2 IMPLANT
NDL SPNL 22GX3.5 QUINCKE BK (NEEDLE) ×1 IMPLANT
NEEDLE SPNL 22GX3.5 QUINCKE BK (NEEDLE) ×2 IMPLANT
SEED GOLD PRELOAD 1.2X3 (Urological Implant) ×2 IMPLANT
SHEATH ULTRASOUND LF (SHEATH) ×1 IMPLANT
SHEATH ULTRASOUND LTX NONSTRL (SHEATH) ×1 IMPLANT
SURGILUBE 2OZ TUBE FLIPTOP (MISCELLANEOUS) ×2 IMPLANT
SYR 10ML LL (SYRINGE) IMPLANT
SYR CONTROL 10ML LL (SYRINGE) ×2 IMPLANT
TOWEL OR 17X26 10 PK STRL BLUE (TOWEL DISPOSABLE) ×2 IMPLANT
UNDERPAD 30X36 HEAVY ABSORB (UNDERPADS AND DIAPERS) ×2 IMPLANT

## 2021-10-03 NOTE — Discharge Instructions (Signed)

## 2021-10-03 NOTE — Anesthesia Postprocedure Evaluation (Signed)
Anesthesia Post Note ? ?Patient: Garrett Snyder ? ?Procedure(s) Performed: GOLD SEED IMPLANT (Prostate) ?SPACE OAR INSTILLATION (Prostate) ? ?  ? ?Patient location during evaluation: PACU ?Anesthesia Type: MAC ?Level of consciousness: awake ?Pain management: pain level controlled ?Vital Signs Assessment: post-procedure vital signs reviewed and stable ?Respiratory status: spontaneous breathing, nonlabored ventilation, respiratory function stable and patient connected to nasal cannula oxygen ?Cardiovascular status: stable and blood pressure returned to baseline ?Postop Assessment: no apparent nausea or vomiting ?Anesthetic complications: no ? ? ?No notable events documented. ? ?Last Vitals:  ?Vitals:  ? 10/03/21 1438 10/03/21 1503  ?BP: (!) 179/103 (!) 165/92  ?Pulse: 72 79  ?Resp: 16 15  ?Temp: 36.4 ?C   ?SpO2: 98% 98%  ?  ?Last Pain:  ?Vitals:  ? 10/03/21 1503  ?TempSrc:   ?PainSc: 0-No pain  ? ? ?  ?  ?  ?  ?  ?  ? ?Leelynn Whetsel P Inna Tisdell ? ? ? ? ?

## 2021-10-03 NOTE — H&P (Signed)
Office Visit Report     09/24/2021  ? ?-------------------------------------------------------------------------------- ?  ?Garrett Snyder  ?MRN: [id number]  ?DOB: Jul 31, 1950, 71 year old Male  ? PRIMARY CARE:  Herbie Baltimore A. Alyson Ingles, MD  ?REFERRINGGlena Norfolk. Lovena Neighbours, MD  ?PROVIDER:  Ellison Hughs, M.D.  ?TREATING:  Daine Gravel, NP  ?LOCATION:  [facility] [phone]  ?  ? ?-------------------------------------------------------------------------------- ?  ?CC/HPI: CC: prostate cancer  ? ?HPI: The patient is a 71 year-old male, previously followed by Dr. Pilar Jarvis, who is currently under active surveillance for very low risk, T1c, Gleason 3+3 = 6 prostate cancer. His cancer was first diagnosed in May 2018 and confirmed on repeat biopsy in September 2018. The patient underwent a surveillance prostate biopsy on 10/24/2018 that showed no evidence of prostate adenocarcinoma in any of the 12 cores. 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 mpMRI in January 2022 showed an equivocal 3 mm PI-RADS 3 lesion with no evidence of locally advanced or pelvic metastatic disease.  ? ?Last PSA: 5.86 (05/2021), 5.59 (11/2020), 4.88 (05/2020), 4.42 (11/2019), 3.96 (09/2018), 3.8 (01/2018), 3.79 (08/2017), 3.72 (02/2017).  ?Prostate volume: 67 cm?  ? ?08/04/2021: The patient is here today for a MRI fusion surveillance biopsy after he was found to have a PI-RADS 3 and 4 lesions on prostate MRI.  ? ?08/14/21: The patient is here today for a routine visit following his surveillance prostate biopsy, which showed multi-focal grade 1 prostate cancer involving 5 out of 16 cores. The ROIs sampled both had involvement of grade 1 cancer. He is doing well after his biopsy and denies any residual hematuria, dysuria or blood per rectum.  ? ?AUASS 2/1  ?SHIM 18  ? ?09/24/2021: 71 year old male who presents today for preoperative appointment prior to undergoing fiducial markers with  SpaceOAR. He denies any changes to his medication history. He denies new allergies. He denies chest pain and shortness of breath.  ? ?  ?ALLERGIES: None  ? ?MEDICATIONS: Allopurinol  ?Aspirin 81 mg tablet,chewable 1 tablet PO Daily  ?Lisinopril  ?Metformin Hcl  ?Simvastatin  ?Januvia 100 mg tablet  ?  ? ?GU PSH: Prostate Needle Biopsy - 08/04/2021, 2020, 2018, 2018 ? ?  ? ?NON-GU PSH: Surgical Pathology, Gross And Microscopic Examination For Prostate Needle - 08/04/2021, 2020, 2018, 2018 ? ?  ? ?GU PMH: Prostate Cancer - 08/14/2021, - 08/04/2021, - 05/27/2021, - 05/27/2020, - 2019, - 2019, - 2018, - 2018, - 2018 ?BPH w/o LUTS - 05/27/2021, - 05/27/2020, - 2018 ?Other male ED - 2020 ?Elevated PSA - 2018, - 2018 ?  ? ?NON-GU PMH: Diabetes Type 2 ?Gout ?Hypercholesterolemia ?Hypertension ?  ? ?FAMILY HISTORY: 1 son - Son  ? ?SOCIAL HISTORY: Marital Status: Married ?Preferred Language: [place]; Ethnicity: Not Hispanic Or Latino; Race: Black or African American ?Current Smoking Status: Patient has never smoked.  ? ?Tobacco Use Assessment Completed: Used Tobacco in last 30 days? ?Has never drank.  ?Does not drink caffeine. ?Patient's occupation is/was Retired. ?  ? ?REVIEW OF SYSTEMS:    ?GU Review Male:   Patient denies frequent urination, trouble starting your stream, erection problems, stream starts and stops, hard to postpone urination, leakage of urine, penile pain, have to strain to urinate , get up at night to urinate, and burning/ pain with urination.  ?Gastrointestinal (Upper):   Patient denies nausea, vomiting, and indigestion/ heartburn.  ?Gastrointestinal (Lower):   Patient denies diarrhea  and constipation.  ?Constitutional:   Patient denies fever, night sweats, weight loss, and fatigue.  ?Skin:   Patient denies skin rash/ lesion and itching.  ?Musculoskeletal:   Patient denies back pain and joint pain.  ?Neurological:   Patient denies headaches and dizziness.  ?Psychologic:   Patient denies depression and anxiety.   ? ?VITAL SIGNS:    ?  09/24/2021 11:37 AM  ?Weight 222 lb / 100.7 kg  ?BP 198/93 mmHg  ?Pulse 84 /min  ?Temperature 97.5 F / 36.3 C  ? ?MULTI-SYSTEM PHYSICAL EXAMINATION:    ?Constitutional: Well-nourished. No physical deformities. Normally developed. Good grooming.  ?Respiratory: No labored breathing, no use of accessory muscles.   ?Cardiovascular: Normal temperature, normal extremity pulses, no swelling, no varicosities.   ?Skin: No paleness, no jaundice, no cyanosis. No lesion, no ulcer, no rash.  ?Neurologic / Psychiatric: Oriented to time, oriented to place, oriented to person. No depression, no anxiety, no agitation.  ?Gastrointestinal: No mass, no tenderness, no rigidity, non obese abdomen.  ? ?  ?Complexity of Data:  ?Source Of History:  Patient  ?Records Review:   Previous Doctor Records, Previous Patient Records  ?Urine Test Review:   Urinalysis  ? 05/20/21 11/25/20 05/20/20 11/16/19 05/08/19 09/12/18 02/03/18 08/16/17  ?PSA  ?Total PSA 5.86 ng/mL 5.59 ng/mL 4.88 ng/mL 4.42 ng/mL 3.74 ng/mL 3.96 ng/mL 3.80 ng/mL 3.79 ng/mL  ? ? 09/24/21  ?Urinalysis  ?Urine Appearance Clear   ?Urine Color Yellow   ?Urine Glucose Trace mg/dL  ?Urine Bilirubin Neg mg/dL  ?Urine Ketones Neg mg/dL  ?Urine Specific Gravity 1.025   ?Urine Blood Neg ery/uL  ?Urine pH 5.5   ?Urine Protein 1+ mg/dL  ?Urine Urobilinogen 0.2 mg/dL  ?Urine Nitrites Neg   ?Urine Leukocyte Esterase Neg leu/uL  ?Urine WBC/hpf 0 - 5/hpf   ?Urine RBC/hpf 0 - 2/hpf   ?Urine Epithelial Cells 0 - 5/hpf   ?Urine Bacteria Rare (0-9/hpf)   ?Urine Mucous Present   ?Urine Yeast NS (Not Seen)   ?Urine Trichomonas Not Present   ?Urine Cystals NS (Not Seen)   ?Urine Casts NS (Not Seen)   ?Urine Sperm Present   ? ?PROCEDURES:    ? ?     Urinalysis w/Scope ?Dipstick Dipstick Cont'd Micro  ?Color: Yellow Bilirubin: Neg mg/dL WBC/hpf: 0 - 5/hpf  ?Appearance: Clear Ketones: Neg mg/dL RBC/hpf: 0 - 2/hpf  ?Specific Gravity: 1.025 Blood: Neg ery/uL Bacteria: Rare (0-9/hpf)   ?pH: 5.5 Protein: 1+ mg/dL Cystals: NS (Not Seen)  ?Glucose: Trace mg/dL Urobilinogen: 0.2 mg/dL Casts: NS (Not Seen)  ?  Nitrites: Neg Trichomonas: Not Present  ?  Leukocyte Esterase: Neg leu/uL Mucous: Present  ?    Epithelial Cells: 0 - 5/hpf  ?    Yeast: NS (Not Seen)  ?    Sperm: Present  ? ? ?ASSESSMENT:  ?    ICD-10 Details  ?1 GU:   Prostate Cancer - C61 Chronic, Stable  ? ?PLAN:    ? ?      Orders ?Labs CULTURE, URINE  ? ? ?      Document ?Letter(s):  Created for Patient: Clinical Summary  ? ? ?     Notes:   Urine sent for precautionary culture today prior to undergoing surgery. Surgical procedure was discussed and all of his questions were answered to the best of my ability. He understands all preoperative instructions. He will notify the clinic with any changes to his history or concerns. He may keep his surgical appointment  as scheduled for 10/03/2021.  ? ?No changes to H&P.  Risks, benefits and alternatives of TRUS guided fiducial marker and space oar placement discussed with the patient.  He voices understanding and wishes to proceed.  ? ?

## 2021-10-03 NOTE — Anesthesia Preprocedure Evaluation (Addendum)
Anesthesia Evaluation  ?Patient identified by MRN, date of birth, ID band ?Patient awake ? ? ? ?Reviewed: ?Allergy & Precautions, NPO status , Patient's Chart, lab work & pertinent test results ? ?Airway ?Mallampati: III ? ?TM Distance: >3 FB ?Neck ROM: Full ? ? ? Dental ? ?(+) Edentulous Upper, Edentulous Lower ?  ?Pulmonary ?neg pulmonary ROS,  ?  ?Pulmonary exam normal ? ? ? ? ? ? ? Cardiovascular ?hypertension, Pt. on medications ?+ Peripheral Vascular Disease  ?Normal cardiovascular exam ? ? ?  ?Neuro/Psych ?negative neurological ROS ? negative psych ROS  ? GI/Hepatic ?negative GI ROS, Neg liver ROS,   ?Endo/Other  ?diabetes, Oral Hypoglycemic Agents ? Renal/GU ?CRFRenal disease  ? ?  ?Musculoskeletal ? ?(+) Arthritis , Gout  ? Abdominal ?  ?Peds ? Hematology ?negative hematology ROS ?(+)   ?Anesthesia Other Findings ?PROSTATE CANCER ? Reproductive/Obstetrics ? ?  ? ? ? ? ? ? ? ? ? ? ? ? ? ?  ?  ? ? ? ? ? ? ? ?Anesthesia Physical ?Anesthesia Plan ? ?ASA: 2 ? ?Anesthesia Plan: MAC  ? ?Post-op Pain Management:   ? ?Induction: Intravenous ? ?PONV Risk Score and Plan: 1 and Ondansetron, Dexamethasone, Propofol infusion and Treatment may vary due to age or medical condition ? ?Airway Management Planned: Simple Face Mask ? ?Additional Equipment:  ? ?Intra-op Plan:  ? ?Post-operative Plan:  ? ?Informed Consent: I have reviewed the patients History and Physical, chart, labs and discussed the procedure including the risks, benefits and alternatives for the proposed anesthesia with the patient or authorized representative who has indicated his/her understanding and acceptance.  ? ? ? ? ? ?Plan Discussed with: CRNA ? ?Anesthesia Plan Comments:   ? ? ? ? ? ? ?Anesthesia Quick Evaluation ? ?

## 2021-10-03 NOTE — Op Note (Signed)
Operative Note ?  ?Preoperative diagnosis:  ?1.  T1c, grade 1 prostate cancer  ?  ?Postoperative diagnosis: ?1.  Same  ?  ?Procedure(s): ?1.  Transrectal ultrasound guided prostatic gold seed fiducial marker and Space OAR placement ?  ?Surgeon: Ellison Hughs, MD ?  ?Assistants:  None  ?  ?Anesthesia:  MAC ?  ?Complications:  None ?  ?EBL:  <5 mL ?  ?Specimens: ?1. None ?  ?Drains/Catheters: ?1.  None ?  ?Intraoperative findings:   ?Fiducial markers were placed at the prostatic base, mid-gland and apex.  SpaceOAR was placed with good separation of the prostate and rectum.  ?  ?Indication: Garrett Snyder is a 71 y.o. male with a history of T1c, grade 1 prostate cancer.  He has elected to proceed with external beam radiation as primary treatment.  He is here today for gold seed fiducial and SpaceOAR placement. ?  ?Description of procedure: ?  ?After informed consent the patient was brought to the OR, placed on the table and administered general anesthesia. He was then moved to the modified lithotomy position with his perineum perpendicular to the floor. His perineum and genitalia were then sterilely prepped. An official timeout was then performed.  ?  ?Real time transrectal ultrasonography was used visualize the prostate.  Gold seed fiducial markers were then placed transperineally at the prostatic base, mid gland and apex. ?  ?I then proceeded with placement of SpaceOAR by introducing a needle with the bevel angled inferiorly approximately 2 cm superior to the anus. This was angled downward and under direct ultrasound was placed within the space between the prostatic capsule and rectum. This was confirmed with a small amount of sterile saline injected and this was performed under direct ultrasound. I then attached the SpaceOAR to the needle and injected this in the space between the prostate and rectum with good placement noted.  The patient tolerated the procedure well and was transferred to the postanesthesia in  stable condition. ?  ?Plan: Follow-up in 4 months with PSA ?  ? ?

## 2021-10-03 NOTE — Transfer of Care (Signed)
Immediate Anesthesia Transfer of Care Note ? ?Patient: Garrett Snyder ? ?Procedure(s) Performed: GOLD SEED IMPLANT (Prostate) ?SPACE OAR INSTILLATION (Prostate) ? ?Patient Location: PACU ? ?Anesthesia Type:MAC ? ?Level of Consciousness: awake, alert , oriented and patient cooperative ? ?Airway & Oxygen Therapy: Patient Spontanous Breathing ? ?Post-op Assessment: Report given to RN and Post -op Vital signs reviewed and stable ? ?Post vital signs: Reviewed and stable ? ?Last Vitals:  ?Vitals Value Taken Time  ?BP    ?Temp    ?Pulse    ?Resp    ?SpO2    ? ? ?Last Pain:  ?Vitals:  ? 10/03/21 1147  ?TempSrc: Oral  ?PainSc: 0-No pain  ?   ? ?Patients Stated Pain Goal: 5 (10/03/21 1147) ? ?Complications: No notable events documented. ?

## 2021-10-06 ENCOUNTER — Telehealth: Payer: Self-pay | Admitting: *Deleted

## 2021-10-06 ENCOUNTER — Encounter (HOSPITAL_BASED_OUTPATIENT_CLINIC_OR_DEPARTMENT_OTHER): Payer: Self-pay | Admitting: Urology

## 2021-10-06 NOTE — Telephone Encounter (Signed)
CALLED PATIENT TO REMIND OF SIM APPT. FOR 10-09-21- ARRIVAL TIME- 12:45 PM @ [facility], INFORMED PATIENT TO ARRIVE WITH A FULL BLADDER AND AN EMPTY BOWEL, SPOKE WITH PATIENT AND HE IS AWARE OF THIS APPT. ?

## 2021-10-08 ENCOUNTER — Telehealth: Payer: Self-pay | Admitting: *Deleted

## 2021-10-08 NOTE — Telephone Encounter (Signed)
CALLED PATIENT TO REMIND OF SIM APPT. FOR 10-09-21- ARRIVAL TIME- 12:45 PM @ [place], PATIENT INFORMED TO ARRIVE WITH A FULL BLADDER AND AN EMPTY BOWEL, SPOKE WITH PATIENT AND HE IS AWARE OF THIS APPT. AND THE INSTRUCTIONS ?

## 2021-10-09 ENCOUNTER — Ambulatory Visit
Admission: RE | Admit: 2021-10-09 | Discharge: 2021-10-09 | Disposition: A | Discharge status: Home / Self Care | Payer: Medicare PPO | Source: Ambulatory Visit | Attending: Radiation Oncology | Admitting: Radiation Oncology

## 2021-10-09 DIAGNOSIS — Z510 Encounter for antineoplastic radiation therapy: Secondary | ICD-10-CM | POA: Insufficient documentation

## 2021-10-09 DIAGNOSIS — Z191 Hormone sensitive malignancy status: Secondary | ICD-10-CM | POA: Diagnosis not present

## 2021-10-09 DIAGNOSIS — C61 Malignant neoplasm of prostate: Secondary | ICD-10-CM | POA: Diagnosis not present

## 2021-10-12 NOTE — Progress Notes (Signed)
?  Radiation Oncology         [phone] ?________________________________ ? ?Name: Garrett Snyder MRN: [id number]  ?Date: 10/09/2021  DOB: Mar 02, 1951 ? ?SIMULATION AND TREATMENT PLANNING NOTE ? ?  ICD-10-CM   ?1. Malignant neoplasm of prostate ([place])  C61   ?  ? ? ?DIAGNOSIS:  71 y.o. gentleman with Stage T1c adenocarcinoma of the prostate with Gleason score of 3+3, and PSA of 5.86. ? ?NARRATIVE:  The patient was brought to the [place].  Identity was confirmed.  All relevant records and images related to the planned course of therapy were reviewed.  The patient freely provided informed written consent to proceed with treatment after reviewing the details related to the planned course of therapy. The consent form was witnessed and verified by the simulation staff.  Then, the patient was set-up in a stable reproducible supine position for radiation therapy.  A vacuum lock pillow device was custom fabricated to position his legs in a reproducible immobilized position.  Then, I performed a urethrogram under sterile conditions to identify the prostatic apex.  CT images were obtained.  Surface markings were placed.  The CT images were loaded into the planning software.  Then the prostate target and avoidance structures including the rectum, bladder, bowel and hips were contoured.  Treatment planning then occurred.  The radiation prescription was entered and confirmed.  A total of one complex treatment devices was fabricated. I have requested : Intensity Modulated Radiotherapy (IMRT) is medically necessary for this case for the following reason:  Rectal sparing.. ? ?PLAN:  The patient will receive 70 Gy in 28 fractions. ? ?________________________________ ? ?Sheral Apley Tammi Klippel, M.D. ? ?

## 2021-10-13 DIAGNOSIS — Z191 Hormone sensitive malignancy status: Secondary | ICD-10-CM | POA: Diagnosis not present

## 2021-10-13 DIAGNOSIS — C61 Malignant neoplasm of prostate: Secondary | ICD-10-CM | POA: Diagnosis not present

## 2021-10-13 DIAGNOSIS — Z510 Encounter for antineoplastic radiation therapy: Secondary | ICD-10-CM | POA: Diagnosis not present

## 2021-10-21 ENCOUNTER — Ambulatory Visit
Admission: RE | Admit: 2021-10-21 | Discharge: 2021-10-21 | Disposition: A | Discharge status: Home / Self Care | Payer: Medicare PPO | Source: Ambulatory Visit | Attending: Radiation Oncology | Admitting: Radiation Oncology

## 2021-10-21 ENCOUNTER — Other Ambulatory Visit: Payer: Self-pay

## 2021-10-21 DIAGNOSIS — Z510 Encounter for antineoplastic radiation therapy: Secondary | ICD-10-CM | POA: Diagnosis not present

## 2021-10-21 DIAGNOSIS — C61 Malignant neoplasm of prostate: Secondary | ICD-10-CM | POA: Diagnosis not present

## 2021-10-21 DIAGNOSIS — Z191 Hormone sensitive malignancy status: Secondary | ICD-10-CM | POA: Diagnosis not present

## 2021-10-21 LAB — RAD ONC ARIA SESSION SUMMARY
Course Elapsed Days: 0
Plan Fractions Treated to Date: 1
Plan Prescribed Dose Per Fraction: 2.5 Gy
Plan Total Fractions Prescribed: 28
Plan Total Prescribed Dose: 70 Gy
Reference Point Dosage Given to Date: 2.5 Gy
Reference Point Session Dosage Given: 2.5 Gy
Session Number: 1

## 2021-10-22 ENCOUNTER — Ambulatory Visit: Payer: Medicare PPO

## 2021-10-22 ENCOUNTER — Ambulatory Visit
Admission: RE | Admit: 2021-10-22 | Discharge: 2021-10-22 | Disposition: A | Discharge status: Home / Self Care | Payer: Medicare PPO | Source: Ambulatory Visit | Attending: Radiation Oncology | Admitting: Radiation Oncology

## 2021-10-22 ENCOUNTER — Other Ambulatory Visit: Payer: Self-pay

## 2021-10-22 DIAGNOSIS — Z191 Hormone sensitive malignancy status: Secondary | ICD-10-CM | POA: Diagnosis not present

## 2021-10-22 DIAGNOSIS — C61 Malignant neoplasm of prostate: Secondary | ICD-10-CM | POA: Diagnosis not present

## 2021-10-22 DIAGNOSIS — Z510 Encounter for antineoplastic radiation therapy: Secondary | ICD-10-CM | POA: Diagnosis not present

## 2021-10-22 LAB — RAD ONC ARIA SESSION SUMMARY
Course Elapsed Days: 1
Plan Fractions Treated to Date: 2
Plan Prescribed Dose Per Fraction: 2.5 Gy
Plan Total Fractions Prescribed: 28
Plan Total Prescribed Dose: 70 Gy
Reference Point Dosage Given to Date: 5 Gy
Reference Point Session Dosage Given: 2.5 Gy
Session Number: 2

## 2021-10-23 ENCOUNTER — Ambulatory Visit
Admission: RE | Admit: 2021-10-23 | Discharge: 2021-10-23 | Disposition: A | Discharge status: Home / Self Care | Payer: Medicare PPO | Source: Ambulatory Visit | Attending: Radiation Oncology | Admitting: Radiation Oncology

## 2021-10-23 ENCOUNTER — Other Ambulatory Visit: Payer: Self-pay

## 2021-10-23 DIAGNOSIS — Z510 Encounter for antineoplastic radiation therapy: Secondary | ICD-10-CM | POA: Diagnosis not present

## 2021-10-23 DIAGNOSIS — Z191 Hormone sensitive malignancy status: Secondary | ICD-10-CM | POA: Diagnosis not present

## 2021-10-23 DIAGNOSIS — C61 Malignant neoplasm of prostate: Secondary | ICD-10-CM | POA: Diagnosis not present

## 2021-10-23 LAB — RAD ONC ARIA SESSION SUMMARY
Course Elapsed Days: 2
Plan Fractions Treated to Date: 3
Plan Prescribed Dose Per Fraction: 2.5 Gy
Plan Total Fractions Prescribed: 28
Plan Total Prescribed Dose: 70 Gy
Reference Point Dosage Given to Date: 7.5 Gy
Reference Point Session Dosage Given: 2.5 Gy
Session Number: 3

## 2021-10-24 ENCOUNTER — Ambulatory Visit
Admission: RE | Admit: 2021-10-24 | Discharge: 2021-10-24 | Disposition: A | Discharge status: Home / Self Care | Payer: Medicare PPO | Source: Ambulatory Visit | Attending: Radiation Oncology | Admitting: Radiation Oncology

## 2021-10-24 ENCOUNTER — Other Ambulatory Visit: Payer: Self-pay

## 2021-10-24 DIAGNOSIS — Z510 Encounter for antineoplastic radiation therapy: Secondary | ICD-10-CM | POA: Diagnosis not present

## 2021-10-24 DIAGNOSIS — Z191 Hormone sensitive malignancy status: Secondary | ICD-10-CM | POA: Diagnosis not present

## 2021-10-24 DIAGNOSIS — C61 Malignant neoplasm of prostate: Secondary | ICD-10-CM | POA: Diagnosis not present

## 2021-10-24 LAB — RAD ONC ARIA SESSION SUMMARY
Course Elapsed Days: 3
Plan Fractions Treated to Date: 4
Plan Prescribed Dose Per Fraction: 2.5 Gy
Plan Total Fractions Prescribed: 28
Plan Total Prescribed Dose: 70 Gy
Reference Point Dosage Given to Date: 10 Gy
Reference Point Session Dosage Given: 2.5 Gy
Session Number: 4

## 2021-10-27 ENCOUNTER — Ambulatory Visit
Admission: RE | Admit: 2021-10-27 | Discharge: 2021-10-27 | Disposition: A | Discharge status: Home / Self Care | Payer: Medicare PPO | Source: Ambulatory Visit | Attending: Radiation Oncology | Admitting: Radiation Oncology

## 2021-10-27 ENCOUNTER — Other Ambulatory Visit: Payer: Self-pay

## 2021-10-27 DIAGNOSIS — C61 Malignant neoplasm of prostate: Secondary | ICD-10-CM | POA: Diagnosis not present

## 2021-10-27 DIAGNOSIS — Z191 Hormone sensitive malignancy status: Secondary | ICD-10-CM | POA: Diagnosis not present

## 2021-10-27 DIAGNOSIS — Z510 Encounter for antineoplastic radiation therapy: Secondary | ICD-10-CM | POA: Diagnosis not present

## 2021-10-27 LAB — RAD ONC ARIA SESSION SUMMARY
Course Elapsed Days: 6
Plan Fractions Treated to Date: 5
Plan Prescribed Dose Per Fraction: 2.5 Gy
Plan Total Fractions Prescribed: 28
Plan Total Prescribed Dose: 70 Gy
Reference Point Dosage Given to Date: 12.5 Gy
Reference Point Session Dosage Given: 2.5 Gy
Session Number: 5

## 2021-10-28 ENCOUNTER — Ambulatory Visit
Admission: RE | Admit: 2021-10-28 | Discharge: 2021-10-28 | Disposition: A | Discharge status: Home / Self Care | Payer: Medicare PPO | Source: Ambulatory Visit | Attending: Radiation Oncology | Admitting: Radiation Oncology

## 2021-10-28 ENCOUNTER — Other Ambulatory Visit: Payer: Self-pay

## 2021-10-28 DIAGNOSIS — Z510 Encounter for antineoplastic radiation therapy: Secondary | ICD-10-CM | POA: Diagnosis not present

## 2021-10-28 DIAGNOSIS — Z191 Hormone sensitive malignancy status: Secondary | ICD-10-CM | POA: Diagnosis not present

## 2021-10-28 DIAGNOSIS — C61 Malignant neoplasm of prostate: Secondary | ICD-10-CM | POA: Diagnosis not present

## 2021-10-28 LAB — RAD ONC ARIA SESSION SUMMARY
Course Elapsed Days: 7
Plan Fractions Treated to Date: 6
Plan Prescribed Dose Per Fraction: 2.5 Gy
Plan Total Fractions Prescribed: 28
Plan Total Prescribed Dose: 70 Gy
Reference Point Dosage Given to Date: 15 Gy
Reference Point Session Dosage Given: 2.5 Gy
Session Number: 6

## 2021-10-29 ENCOUNTER — Ambulatory Visit
Admission: RE | Admit: 2021-10-29 | Discharge: 2021-10-29 | Disposition: A | Discharge status: Home / Self Care | Payer: Medicare PPO | Source: Ambulatory Visit | Attending: Radiation Oncology | Admitting: Radiation Oncology

## 2021-10-29 ENCOUNTER — Other Ambulatory Visit: Payer: Self-pay

## 2021-10-29 DIAGNOSIS — Z191 Hormone sensitive malignancy status: Secondary | ICD-10-CM | POA: Diagnosis not present

## 2021-10-29 DIAGNOSIS — Z510 Encounter for antineoplastic radiation therapy: Secondary | ICD-10-CM | POA: Diagnosis not present

## 2021-10-29 DIAGNOSIS — C61 Malignant neoplasm of prostate: Secondary | ICD-10-CM | POA: Diagnosis not present

## 2021-10-29 LAB — RAD ONC ARIA SESSION SUMMARY
Course Elapsed Days: 8
Plan Fractions Treated to Date: 7
Plan Prescribed Dose Per Fraction: 2.5 Gy
Plan Total Fractions Prescribed: 28
Plan Total Prescribed Dose: 70 Gy
Reference Point Dosage Given to Date: 17.5 Gy
Reference Point Session Dosage Given: 2.5 Gy
Session Number: 7

## 2021-10-30 ENCOUNTER — Other Ambulatory Visit: Payer: Self-pay

## 2021-10-30 ENCOUNTER — Ambulatory Visit
Admission: RE | Admit: 2021-10-30 | Discharge: 2021-10-30 | Disposition: A | Discharge status: Home / Self Care | Payer: Medicare PPO | Source: Ambulatory Visit | Attending: Radiation Oncology | Admitting: Radiation Oncology

## 2021-10-30 DIAGNOSIS — Z191 Hormone sensitive malignancy status: Secondary | ICD-10-CM | POA: Diagnosis not present

## 2021-10-30 DIAGNOSIS — C61 Malignant neoplasm of prostate: Secondary | ICD-10-CM | POA: Diagnosis not present

## 2021-10-30 DIAGNOSIS — Z510 Encounter for antineoplastic radiation therapy: Secondary | ICD-10-CM | POA: Diagnosis not present

## 2021-10-30 LAB — RAD ONC ARIA SESSION SUMMARY
Course Elapsed Days: 9
Plan Fractions Treated to Date: 8
Plan Prescribed Dose Per Fraction: 2.5 Gy
Plan Total Fractions Prescribed: 28
Plan Total Prescribed Dose: 70 Gy
Reference Point Dosage Given to Date: 20 Gy
Reference Point Session Dosage Given: 2.5 Gy
Session Number: 8

## 2021-10-31 ENCOUNTER — Ambulatory Visit
Admission: RE | Admit: 2021-10-31 | Discharge: 2021-10-31 | Disposition: A | Discharge status: Home / Self Care | Payer: Medicare PPO | Source: Ambulatory Visit | Attending: Radiation Oncology | Admitting: Radiation Oncology

## 2021-10-31 ENCOUNTER — Other Ambulatory Visit: Payer: Self-pay

## 2021-10-31 DIAGNOSIS — Z191 Hormone sensitive malignancy status: Secondary | ICD-10-CM | POA: Diagnosis not present

## 2021-10-31 DIAGNOSIS — C61 Malignant neoplasm of prostate: Secondary | ICD-10-CM | POA: Diagnosis not present

## 2021-10-31 DIAGNOSIS — Z510 Encounter for antineoplastic radiation therapy: Secondary | ICD-10-CM | POA: Diagnosis not present

## 2021-10-31 LAB — RAD ONC ARIA SESSION SUMMARY
Course Elapsed Days: 10
Plan Fractions Treated to Date: 9
Plan Prescribed Dose Per Fraction: 2.5 Gy
Plan Total Fractions Prescribed: 28
Plan Total Prescribed Dose: 70 Gy
Reference Point Dosage Given to Date: 22.5 Gy
Reference Point Session Dosage Given: 2.5 Gy
Session Number: 9

## 2021-11-04 ENCOUNTER — Other Ambulatory Visit: Payer: Self-pay

## 2021-11-04 ENCOUNTER — Ambulatory Visit
Admission: RE | Admit: 2021-11-04 | Discharge: 2021-11-04 | Disposition: A | Discharge status: Home / Self Care | Payer: Medicare PPO | Source: Ambulatory Visit | Attending: Radiation Oncology | Admitting: Radiation Oncology

## 2021-11-04 DIAGNOSIS — Z510 Encounter for antineoplastic radiation therapy: Secondary | ICD-10-CM | POA: Diagnosis not present

## 2021-11-04 DIAGNOSIS — Z191 Hormone sensitive malignancy status: Secondary | ICD-10-CM | POA: Diagnosis not present

## 2021-11-04 DIAGNOSIS — C61 Malignant neoplasm of prostate: Secondary | ICD-10-CM | POA: Diagnosis not present

## 2021-11-04 LAB — RAD ONC ARIA SESSION SUMMARY
Course Elapsed Days: 14
Plan Fractions Treated to Date: 10
Plan Prescribed Dose Per Fraction: 2.5 Gy
Plan Total Fractions Prescribed: 28
Plan Total Prescribed Dose: 70 Gy
Reference Point Dosage Given to Date: 25 Gy
Reference Point Session Dosage Given: 2.5 Gy
Session Number: 10

## 2021-11-05 ENCOUNTER — Ambulatory Visit
Admission: RE | Admit: 2021-11-05 | Discharge: 2021-11-05 | Disposition: A | Discharge status: Home / Self Care | Payer: Medicare PPO | Source: Ambulatory Visit | Attending: Radiation Oncology | Admitting: Radiation Oncology

## 2021-11-05 ENCOUNTER — Other Ambulatory Visit: Payer: Self-pay

## 2021-11-05 DIAGNOSIS — Z191 Hormone sensitive malignancy status: Secondary | ICD-10-CM | POA: Diagnosis not present

## 2021-11-05 DIAGNOSIS — C61 Malignant neoplasm of prostate: Secondary | ICD-10-CM | POA: Diagnosis not present

## 2021-11-05 DIAGNOSIS — Z510 Encounter for antineoplastic radiation therapy: Secondary | ICD-10-CM | POA: Diagnosis not present

## 2021-11-05 LAB — RAD ONC ARIA SESSION SUMMARY
Course Elapsed Days: 15
Plan Fractions Treated to Date: 11
Plan Prescribed Dose Per Fraction: 2.5 Gy
Plan Total Fractions Prescribed: 28
Plan Total Prescribed Dose: 70 Gy
Reference Point Dosage Given to Date: 27.5 Gy
Reference Point Session Dosage Given: 2.5 Gy
Session Number: 11

## 2021-11-06 ENCOUNTER — Other Ambulatory Visit: Payer: Self-pay

## 2021-11-06 ENCOUNTER — Ambulatory Visit
Admission: RE | Admit: 2021-11-06 | Discharge: 2021-11-06 | Disposition: A | Discharge status: Home / Self Care | Payer: Medicare PPO | Source: Ambulatory Visit | Attending: Radiation Oncology | Admitting: Radiation Oncology

## 2021-11-06 DIAGNOSIS — C61 Malignant neoplasm of prostate: Secondary | ICD-10-CM | POA: Diagnosis not present

## 2021-11-06 DIAGNOSIS — Z510 Encounter for antineoplastic radiation therapy: Secondary | ICD-10-CM | POA: Diagnosis not present

## 2021-11-06 DIAGNOSIS — Z191 Hormone sensitive malignancy status: Secondary | ICD-10-CM | POA: Diagnosis not present

## 2021-11-06 LAB — RAD ONC ARIA SESSION SUMMARY
Course Elapsed Days: 16
Plan Fractions Treated to Date: 12
Plan Prescribed Dose Per Fraction: 2.5 Gy
Plan Total Fractions Prescribed: 28
Plan Total Prescribed Dose: 70 Gy
Reference Point Dosage Given to Date: 30 Gy
Reference Point Session Dosage Given: 2.5 Gy
Session Number: 12

## 2021-11-07 ENCOUNTER — Ambulatory Visit
Admission: RE | Admit: 2021-11-07 | Discharge: 2021-11-07 | Disposition: A | Discharge status: Home / Self Care | Payer: Medicare PPO | Source: Ambulatory Visit | Attending: Radiation Oncology | Admitting: Radiation Oncology

## 2021-11-07 ENCOUNTER — Other Ambulatory Visit: Payer: Self-pay

## 2021-11-07 DIAGNOSIS — Z191 Hormone sensitive malignancy status: Secondary | ICD-10-CM | POA: Diagnosis not present

## 2021-11-07 DIAGNOSIS — Z510 Encounter for antineoplastic radiation therapy: Secondary | ICD-10-CM | POA: Diagnosis not present

## 2021-11-07 DIAGNOSIS — C61 Malignant neoplasm of prostate: Secondary | ICD-10-CM | POA: Diagnosis not present

## 2021-11-07 LAB — RAD ONC ARIA SESSION SUMMARY
Course Elapsed Days: 17
Plan Fractions Treated to Date: 13
Plan Prescribed Dose Per Fraction: 2.5 Gy
Plan Total Fractions Prescribed: 28
Plan Total Prescribed Dose: 70 Gy
Reference Point Dosage Given to Date: 32.5 Gy
Reference Point Session Dosage Given: 2.5 Gy
Session Number: 13

## 2021-11-10 ENCOUNTER — Other Ambulatory Visit: Payer: Self-pay

## 2021-11-10 ENCOUNTER — Ambulatory Visit
Admission: RE | Admit: 2021-11-10 | Discharge: 2021-11-10 | Disposition: A | Discharge status: Home / Self Care | Payer: Medicare PPO | Source: Ambulatory Visit | Attending: Radiation Oncology | Admitting: Radiation Oncology

## 2021-11-10 DIAGNOSIS — Z510 Encounter for antineoplastic radiation therapy: Secondary | ICD-10-CM | POA: Diagnosis not present

## 2021-11-10 DIAGNOSIS — Z191 Hormone sensitive malignancy status: Secondary | ICD-10-CM | POA: Diagnosis not present

## 2021-11-10 DIAGNOSIS — C61 Malignant neoplasm of prostate: Secondary | ICD-10-CM | POA: Diagnosis not present

## 2021-11-10 LAB — RAD ONC ARIA SESSION SUMMARY
Course Elapsed Days: 20
Plan Fractions Treated to Date: 14
Plan Prescribed Dose Per Fraction: 2.5 Gy
Plan Total Fractions Prescribed: 28
Plan Total Prescribed Dose: 70 Gy
Reference Point Dosage Given to Date: 35 Gy
Reference Point Session Dosage Given: 2.5 Gy
Session Number: 14

## 2021-11-11 ENCOUNTER — Other Ambulatory Visit: Payer: Self-pay

## 2021-11-11 ENCOUNTER — Ambulatory Visit
Admission: RE | Admit: 2021-11-11 | Discharge: 2021-11-11 | Disposition: A | Discharge status: Home / Self Care | Payer: Medicare PPO | Source: Ambulatory Visit | Attending: Radiation Oncology | Admitting: Radiation Oncology

## 2021-11-11 DIAGNOSIS — Z510 Encounter for antineoplastic radiation therapy: Secondary | ICD-10-CM | POA: Diagnosis not present

## 2021-11-11 DIAGNOSIS — C61 Malignant neoplasm of prostate: Secondary | ICD-10-CM | POA: Diagnosis not present

## 2021-11-11 DIAGNOSIS — Z191 Hormone sensitive malignancy status: Secondary | ICD-10-CM | POA: Diagnosis not present

## 2021-11-11 LAB — RAD ONC ARIA SESSION SUMMARY
Course Elapsed Days: 21
Plan Fractions Treated to Date: 15
Plan Prescribed Dose Per Fraction: 2.5 Gy
Plan Total Fractions Prescribed: 28
Plan Total Prescribed Dose: 70 Gy
Reference Point Dosage Given to Date: 37.5 Gy
Reference Point Session Dosage Given: 2.5 Gy
Session Number: 15

## 2021-11-12 ENCOUNTER — Other Ambulatory Visit: Payer: Self-pay

## 2021-11-12 ENCOUNTER — Ambulatory Visit: Admission: RE | Admit: 2021-11-12 | Payer: Medicare PPO | Source: Ambulatory Visit

## 2021-11-13 ENCOUNTER — Other Ambulatory Visit: Payer: Self-pay

## 2021-11-13 ENCOUNTER — Ambulatory Visit
Admission: RE | Admit: 2021-11-13 | Discharge: 2021-11-13 | Disposition: A | Discharge status: Home / Self Care | Payer: Medicare PPO | Source: Ambulatory Visit | Attending: Radiation Oncology | Admitting: Radiation Oncology

## 2021-11-13 DIAGNOSIS — C61 Malignant neoplasm of prostate: Secondary | ICD-10-CM | POA: Diagnosis not present

## 2021-11-13 DIAGNOSIS — Z191 Hormone sensitive malignancy status: Secondary | ICD-10-CM | POA: Diagnosis not present

## 2021-11-13 DIAGNOSIS — Z510 Encounter for antineoplastic radiation therapy: Secondary | ICD-10-CM | POA: Diagnosis not present

## 2021-11-13 LAB — RAD ONC ARIA SESSION SUMMARY
Course Elapsed Days: 23
Plan Fractions Treated to Date: 16
Plan Prescribed Dose Per Fraction: 2.5 Gy
Plan Total Fractions Prescribed: 28
Plan Total Prescribed Dose: 70 Gy
Reference Point Dosage Given to Date: 40 Gy
Reference Point Session Dosage Given: 2.5 Gy
Session Number: 16

## 2021-11-14 ENCOUNTER — Other Ambulatory Visit: Payer: Self-pay

## 2021-11-14 ENCOUNTER — Ambulatory Visit
Admission: RE | Admit: 2021-11-14 | Discharge: 2021-11-14 | Disposition: A | Discharge status: Home / Self Care | Payer: Medicare PPO | Source: Ambulatory Visit | Attending: Radiation Oncology | Admitting: Radiation Oncology

## 2021-11-14 DIAGNOSIS — Z510 Encounter for antineoplastic radiation therapy: Secondary | ICD-10-CM | POA: Diagnosis not present

## 2021-11-14 DIAGNOSIS — C61 Malignant neoplasm of prostate: Secondary | ICD-10-CM | POA: Diagnosis not present

## 2021-11-14 DIAGNOSIS — Z191 Hormone sensitive malignancy status: Secondary | ICD-10-CM | POA: Diagnosis not present

## 2021-11-14 LAB — RAD ONC ARIA SESSION SUMMARY
Course Elapsed Days: 24
Plan Fractions Treated to Date: 17
Plan Prescribed Dose Per Fraction: 2.5 Gy
Plan Total Fractions Prescribed: 28
Plan Total Prescribed Dose: 70 Gy
Reference Point Dosage Given to Date: 42.5 Gy
Reference Point Session Dosage Given: 2.5 Gy
Session Number: 17

## 2021-11-17 ENCOUNTER — Other Ambulatory Visit: Payer: Self-pay

## 2021-11-17 ENCOUNTER — Ambulatory Visit
Admission: RE | Admit: 2021-11-17 | Discharge: 2021-11-17 | Disposition: A | Discharge status: Home / Self Care | Payer: Medicare PPO | Source: Ambulatory Visit | Attending: Radiation Oncology | Admitting: Radiation Oncology

## 2021-11-17 DIAGNOSIS — Z510 Encounter for antineoplastic radiation therapy: Secondary | ICD-10-CM | POA: Diagnosis not present

## 2021-11-17 DIAGNOSIS — Z191 Hormone sensitive malignancy status: Secondary | ICD-10-CM | POA: Diagnosis not present

## 2021-11-17 DIAGNOSIS — C61 Malignant neoplasm of prostate: Secondary | ICD-10-CM | POA: Diagnosis not present

## 2021-11-17 LAB — RAD ONC ARIA SESSION SUMMARY
Course Elapsed Days: 27
Plan Fractions Treated to Date: 18
Plan Prescribed Dose Per Fraction: 2.5 Gy
Plan Total Fractions Prescribed: 28
Plan Total Prescribed Dose: 70 Gy
Reference Point Dosage Given to Date: 45 Gy
Reference Point Session Dosage Given: 2.5 Gy
Session Number: 18

## 2021-11-18 ENCOUNTER — Other Ambulatory Visit: Payer: Self-pay

## 2021-11-18 ENCOUNTER — Ambulatory Visit
Admission: RE | Admit: 2021-11-18 | Discharge: 2021-11-18 | Disposition: A | Discharge status: Home / Self Care | Payer: Medicare PPO | Source: Ambulatory Visit | Attending: Radiation Oncology | Admitting: Radiation Oncology

## 2021-11-18 DIAGNOSIS — Z191 Hormone sensitive malignancy status: Secondary | ICD-10-CM | POA: Diagnosis not present

## 2021-11-18 DIAGNOSIS — Z510 Encounter for antineoplastic radiation therapy: Secondary | ICD-10-CM | POA: Diagnosis not present

## 2021-11-18 DIAGNOSIS — C61 Malignant neoplasm of prostate: Secondary | ICD-10-CM | POA: Diagnosis not present

## 2021-11-18 LAB — RAD ONC ARIA SESSION SUMMARY
Course Elapsed Days: 28
Plan Fractions Treated to Date: 19
Plan Prescribed Dose Per Fraction: 2.5 Gy
Plan Total Fractions Prescribed: 28
Plan Total Prescribed Dose: 70 Gy
Reference Point Dosage Given to Date: 47.5 Gy
Reference Point Session Dosage Given: 2.5 Gy
Session Number: 19

## 2021-11-19 ENCOUNTER — Other Ambulatory Visit: Payer: Self-pay

## 2021-11-19 ENCOUNTER — Ambulatory Visit
Admission: RE | Admit: 2021-11-19 | Discharge: 2021-11-19 | Disposition: A | Discharge status: Home / Self Care | Payer: Medicare PPO | Source: Ambulatory Visit | Attending: Radiation Oncology | Admitting: Radiation Oncology

## 2021-11-19 DIAGNOSIS — C61 Malignant neoplasm of prostate: Secondary | ICD-10-CM | POA: Diagnosis not present

## 2021-11-19 DIAGNOSIS — Z510 Encounter for antineoplastic radiation therapy: Secondary | ICD-10-CM | POA: Diagnosis not present

## 2021-11-19 DIAGNOSIS — Z191 Hormone sensitive malignancy status: Secondary | ICD-10-CM | POA: Diagnosis not present

## 2021-11-19 LAB — RAD ONC ARIA SESSION SUMMARY
Course Elapsed Days: 29
Plan Fractions Treated to Date: 20
Plan Prescribed Dose Per Fraction: 2.5 Gy
Plan Total Fractions Prescribed: 28
Plan Total Prescribed Dose: 70 Gy
Reference Point Dosage Given to Date: 50 Gy
Reference Point Session Dosage Given: 2.5 Gy
Session Number: 20

## 2021-11-20 ENCOUNTER — Other Ambulatory Visit: Payer: Self-pay

## 2021-11-20 ENCOUNTER — Ambulatory Visit
Admission: RE | Admit: 2021-11-20 | Discharge: 2021-11-20 | Disposition: A | Discharge status: Home / Self Care | Payer: Medicare PPO | Source: Ambulatory Visit | Attending: Radiation Oncology | Admitting: Radiation Oncology

## 2021-11-20 DIAGNOSIS — Z510 Encounter for antineoplastic radiation therapy: Secondary | ICD-10-CM | POA: Diagnosis not present

## 2021-11-20 DIAGNOSIS — C61 Malignant neoplasm of prostate: Secondary | ICD-10-CM | POA: Diagnosis not present

## 2021-11-20 DIAGNOSIS — Z191 Hormone sensitive malignancy status: Secondary | ICD-10-CM | POA: Diagnosis not present

## 2021-11-20 LAB — RAD ONC ARIA SESSION SUMMARY
Course Elapsed Days: 30
Plan Fractions Treated to Date: 21
Plan Prescribed Dose Per Fraction: 2.5 Gy
Plan Total Fractions Prescribed: 28
Plan Total Prescribed Dose: 70 Gy
Reference Point Dosage Given to Date: 52.5 Gy
Reference Point Session Dosage Given: 2.5 Gy
Session Number: 21

## 2021-11-21 ENCOUNTER — Other Ambulatory Visit: Payer: Self-pay

## 2021-11-21 ENCOUNTER — Ambulatory Visit
Admission: RE | Admit: 2021-11-21 | Discharge: 2021-11-21 | Disposition: A | Discharge status: Home / Self Care | Payer: Medicare PPO | Source: Ambulatory Visit | Attending: Radiation Oncology | Admitting: Radiation Oncology

## 2021-11-21 DIAGNOSIS — C61 Malignant neoplasm of prostate: Secondary | ICD-10-CM | POA: Diagnosis not present

## 2021-11-21 DIAGNOSIS — Z510 Encounter for antineoplastic radiation therapy: Secondary | ICD-10-CM | POA: Diagnosis not present

## 2021-11-21 DIAGNOSIS — Z191 Hormone sensitive malignancy status: Secondary | ICD-10-CM | POA: Diagnosis not present

## 2021-11-21 LAB — RAD ONC ARIA SESSION SUMMARY
Course Elapsed Days: 31
Plan Fractions Treated to Date: 22
Plan Prescribed Dose Per Fraction: 2.5 Gy
Plan Total Fractions Prescribed: 28
Plan Total Prescribed Dose: 70 Gy
Reference Point Dosage Given to Date: 55 Gy
Reference Point Session Dosage Given: 2.5 Gy
Session Number: 22

## 2021-11-24 ENCOUNTER — Other Ambulatory Visit: Payer: Self-pay

## 2021-11-24 ENCOUNTER — Ambulatory Visit
Admission: RE | Admit: 2021-11-24 | Discharge: 2021-11-24 | Disposition: A | Discharge status: Home / Self Care | Payer: Medicare PPO | Source: Ambulatory Visit | Attending: Radiation Oncology | Admitting: Radiation Oncology

## 2021-11-24 DIAGNOSIS — C61 Malignant neoplasm of prostate: Secondary | ICD-10-CM | POA: Diagnosis not present

## 2021-11-24 DIAGNOSIS — Z510 Encounter for antineoplastic radiation therapy: Secondary | ICD-10-CM | POA: Diagnosis not present

## 2021-11-24 DIAGNOSIS — Z191 Hormone sensitive malignancy status: Secondary | ICD-10-CM | POA: Diagnosis not present

## 2021-11-24 LAB — RAD ONC ARIA SESSION SUMMARY
Course Elapsed Days: 34
Plan Fractions Treated to Date: 23
Plan Prescribed Dose Per Fraction: 2.5 Gy
Plan Total Fractions Prescribed: 28
Plan Total Prescribed Dose: 70 Gy
Reference Point Dosage Given to Date: 57.5 Gy
Reference Point Session Dosage Given: 2.5 Gy
Session Number: 23

## 2021-11-25 ENCOUNTER — Other Ambulatory Visit: Payer: Self-pay

## 2021-11-25 ENCOUNTER — Ambulatory Visit
Admission: RE | Admit: 2021-11-25 | Discharge: 2021-11-25 | Disposition: A | Discharge status: Home / Self Care | Payer: Medicare PPO | Source: Ambulatory Visit | Attending: Radiation Oncology | Admitting: Radiation Oncology

## 2021-11-25 DIAGNOSIS — Z510 Encounter for antineoplastic radiation therapy: Secondary | ICD-10-CM | POA: Diagnosis not present

## 2021-11-25 DIAGNOSIS — C61 Malignant neoplasm of prostate: Secondary | ICD-10-CM | POA: Diagnosis not present

## 2021-11-25 DIAGNOSIS — Z191 Hormone sensitive malignancy status: Secondary | ICD-10-CM | POA: Diagnosis not present

## 2021-11-25 LAB — RAD ONC ARIA SESSION SUMMARY
Course Elapsed Days: 35
Plan Fractions Treated to Date: 24
Plan Prescribed Dose Per Fraction: 2.5 Gy
Plan Total Fractions Prescribed: 28
Plan Total Prescribed Dose: 70 Gy
Reference Point Dosage Given to Date: 60 Gy
Reference Point Session Dosage Given: 2.5 Gy
Session Number: 24

## 2021-11-26 ENCOUNTER — Other Ambulatory Visit: Payer: Self-pay

## 2021-11-26 ENCOUNTER — Ambulatory Visit
Admission: RE | Admit: 2021-11-26 | Discharge: 2021-11-26 | Disposition: A | Discharge status: Home / Self Care | Payer: Medicare PPO | Source: Ambulatory Visit | Attending: Radiation Oncology | Admitting: Radiation Oncology

## 2021-11-26 DIAGNOSIS — Z510 Encounter for antineoplastic radiation therapy: Secondary | ICD-10-CM | POA: Diagnosis not present

## 2021-11-26 DIAGNOSIS — C61 Malignant neoplasm of prostate: Secondary | ICD-10-CM | POA: Diagnosis not present

## 2021-11-26 DIAGNOSIS — Z191 Hormone sensitive malignancy status: Secondary | ICD-10-CM | POA: Diagnosis not present

## 2021-11-26 LAB — RAD ONC ARIA SESSION SUMMARY
Course Elapsed Days: 36
Plan Fractions Treated to Date: 25
Plan Prescribed Dose Per Fraction: 2.5 Gy
Plan Total Fractions Prescribed: 28
Plan Total Prescribed Dose: 70 Gy
Reference Point Dosage Given to Date: 62.5 Gy
Reference Point Session Dosage Given: 2.5 Gy
Session Number: 25

## 2021-11-27 ENCOUNTER — Ambulatory Visit
Admission: RE | Admit: 2021-11-27 | Discharge: 2021-11-27 | Disposition: A | Discharge status: Home / Self Care | Payer: Medicare PPO | Source: Ambulatory Visit | Attending: Radiation Oncology | Admitting: Radiation Oncology

## 2021-11-27 ENCOUNTER — Other Ambulatory Visit: Payer: Self-pay

## 2021-11-27 DIAGNOSIS — Z510 Encounter for antineoplastic radiation therapy: Secondary | ICD-10-CM | POA: Diagnosis not present

## 2021-11-27 DIAGNOSIS — Z191 Hormone sensitive malignancy status: Secondary | ICD-10-CM | POA: Diagnosis not present

## 2021-11-27 DIAGNOSIS — C61 Malignant neoplasm of prostate: Secondary | ICD-10-CM | POA: Diagnosis not present

## 2021-11-27 LAB — RAD ONC ARIA SESSION SUMMARY
Course Elapsed Days: 37
Plan Fractions Treated to Date: 26
Plan Prescribed Dose Per Fraction: 2.5 Gy
Plan Total Fractions Prescribed: 28
Plan Total Prescribed Dose: 70 Gy
Reference Point Dosage Given to Date: 65 Gy
Reference Point Session Dosage Given: 2.5 Gy
Session Number: 26

## 2021-11-28 ENCOUNTER — Other Ambulatory Visit: Payer: Self-pay

## 2021-11-28 ENCOUNTER — Ambulatory Visit: Payer: Medicare PPO

## 2021-11-28 ENCOUNTER — Ambulatory Visit
Admission: RE | Admit: 2021-11-28 | Discharge: 2021-11-28 | Disposition: A | Discharge status: Home / Self Care | Payer: Medicare PPO | Source: Ambulatory Visit | Attending: Radiation Oncology | Admitting: Radiation Oncology

## 2021-11-28 DIAGNOSIS — Z191 Hormone sensitive malignancy status: Secondary | ICD-10-CM | POA: Diagnosis not present

## 2021-11-28 DIAGNOSIS — Z510 Encounter for antineoplastic radiation therapy: Secondary | ICD-10-CM | POA: Diagnosis not present

## 2021-11-28 DIAGNOSIS — C61 Malignant neoplasm of prostate: Secondary | ICD-10-CM | POA: Diagnosis not present

## 2021-11-28 LAB — RAD ONC ARIA SESSION SUMMARY
Course Elapsed Days: 38
Plan Fractions Treated to Date: 27
Plan Prescribed Dose Per Fraction: 2.5 Gy
Plan Total Fractions Prescribed: 28
Plan Total Prescribed Dose: 70 Gy
Reference Point Dosage Given to Date: 67.5 Gy
Reference Point Session Dosage Given: 2.5 Gy
Session Number: 27

## 2021-12-01 ENCOUNTER — Ambulatory Visit
Admission: RE | Admit: 2021-12-01 | Discharge: 2021-12-01 | Disposition: A | Discharge status: Home / Self Care | Payer: Medicare PPO | Source: Ambulatory Visit | Attending: Radiation Oncology | Admitting: Radiation Oncology

## 2021-12-01 ENCOUNTER — Encounter: Payer: Self-pay | Admitting: Urology

## 2021-12-01 ENCOUNTER — Ambulatory Visit: Payer: Medicare PPO

## 2021-12-01 ENCOUNTER — Other Ambulatory Visit: Payer: Self-pay

## 2021-12-01 DIAGNOSIS — C61 Malignant neoplasm of prostate: Secondary | ICD-10-CM

## 2021-12-01 DIAGNOSIS — Z191 Hormone sensitive malignancy status: Secondary | ICD-10-CM | POA: Diagnosis not present

## 2021-12-01 DIAGNOSIS — Z510 Encounter for antineoplastic radiation therapy: Secondary | ICD-10-CM | POA: Diagnosis not present

## 2021-12-01 LAB — RAD ONC ARIA SESSION SUMMARY
Course Elapsed Days: 41
Plan Fractions Treated to Date: 28
Plan Prescribed Dose Per Fraction: 2.5 Gy
Plan Total Fractions Prescribed: 28
Plan Total Prescribed Dose: 70 Gy
Reference Point Dosage Given to Date: 70 Gy
Reference Point Session Dosage Given: 2.5 Gy
Session Number: 28

## 2021-12-04 DIAGNOSIS — Z0000 Encounter for general adult medical examination without abnormal findings: Secondary | ICD-10-CM | POA: Diagnosis not present

## 2021-12-04 DIAGNOSIS — E7800 Pure hypercholesterolemia, unspecified: Secondary | ICD-10-CM | POA: Diagnosis not present

## 2021-12-04 DIAGNOSIS — I739 Peripheral vascular disease, unspecified: Secondary | ICD-10-CM | POA: Diagnosis not present

## 2021-12-04 DIAGNOSIS — E1122 Type 2 diabetes mellitus with diabetic chronic kidney disease: Secondary | ICD-10-CM | POA: Diagnosis not present

## 2021-12-04 DIAGNOSIS — I129 Hypertensive chronic kidney disease with stage 1 through stage 4 chronic kidney disease, or unspecified chronic kidney disease: Secondary | ICD-10-CM | POA: Diagnosis not present

## 2021-12-04 DIAGNOSIS — Z8546 Personal history of malignant neoplasm of prostate: Secondary | ICD-10-CM | POA: Diagnosis not present

## 2021-12-04 DIAGNOSIS — B070 Plantar wart: Secondary | ICD-10-CM | POA: Diagnosis not present

## 2021-12-04 DIAGNOSIS — Z1331 Encounter for screening for depression: Secondary | ICD-10-CM | POA: Diagnosis not present

## 2021-12-04 DIAGNOSIS — M109 Gout, unspecified: Secondary | ICD-10-CM | POA: Diagnosis not present

## 2022-01-09 ENCOUNTER — Encounter: Payer: Self-pay | Admitting: Urology

## 2022-01-09 NOTE — Progress Notes (Signed)
Telephone appointment. I verified patient's identity and began nursing interview. No issues reported at this time.  Meaningful use complete. I-PSS score of 1-mild. No urinary management medications. Urology appt- Aug, 2023  Reminded patient of his 9:30am-01/14/22 telephone appointment w/ Ashlyn Bruning PA-C. I left my extension [phone] in case patient needs anything. Patient verbalized understanding.  Patient contact [phone]

## 2022-01-12 DIAGNOSIS — D696 Thrombocytopenia, unspecified: Secondary | ICD-10-CM | POA: Diagnosis not present

## 2022-01-14 ENCOUNTER — Ambulatory Visit
Admission: RE | Admit: 2022-01-14 | Discharge: 2022-01-14 | Disposition: A | Discharge status: Home / Self Care | Payer: Medicare PPO | Source: Ambulatory Visit | Attending: Urology | Admitting: Urology

## 2022-01-14 ENCOUNTER — Other Ambulatory Visit: Payer: Self-pay | Admitting: Urology

## 2022-01-14 DIAGNOSIS — C61 Malignant neoplasm of prostate: Secondary | ICD-10-CM

## 2022-01-14 NOTE — Progress Notes (Signed)
  Radiation Oncology         [phone] ________________________________  Name: Garrett Snyder MRN: [id number]  Date: 12/01/2021  DOB: 1951/01/12  End of Treatment Note  Diagnosis:   71 y.o. gentleman with Stage T1c adenocarcinoma of the prostate with Gleason score of 3+3, and PSA of 5.86.     Indication for treatment:  Curative, Definitive Radiotherapy       Radiation treatment dates:   10/21/21 - 12/01/21  Site/dose:   The prostate was treated to 70 Gy in 28 fractions of 2.5 Gy  Beams/energy:   The patient was treated with IMRT using volumetric arc therapy delivering 6 MV X-rays to clockwise and counterclockwise circumferential arcs with a 90 degree collimator offset to avoid dose scalloping.  Image guidance was performed with daily cone beam CT prior to each fraction to align to gold markers in the prostate and assure proper bladder and rectal fill volumes.  Immobilization was achieved with Body[employer] custom mold.  Narrative: The patient tolerated radiation treatment relatively well with only minor urinary irritation and modest fatigue.  He did report nocturia 1-2 times per night but specifically denied dysuria, gross hematuria, incomplete bladder emptying or incontinence.  He denied any abdominal pain, nausea, vomiting, diarrhea or constipation.  Plan: The patient has completed radiation treatment. He will return to radiation oncology clinic for routine followup in one month. I advised him to call or return sooner if he has any questions or concerns related to his recovery or treatment. ________________________________  Sheral Apley. Tammi Klippel, M.D.

## 2022-01-14 NOTE — Progress Notes (Signed)
Radiation Oncology         [phone] ________________________________  Name: Garrett Snyder MRN: [id number]  Date: 01/14/2022  DOB: [birth year]  Post Treatment Note  CC: Garrett Dus, MD  Garrett Snyder*  Diagnosis:   71 y.o. gentleman with Stage T1c adenocarcinoma of the prostate with Gleason score of 3+3, and PSA of 5.86.  Interval Since Last Radiation:  6 weeks   10/21/21 - 12/01/21:  The prostate was treated to 70 Gy in 28 fractions of 2.5 Gy  Narrative:  I spoke with the patient to conduct his routine scheduled 1 month follow up visit via telephone to spare the patient unnecessary potential exposure in the healthcare setting during the current COVID-19 pandemic.  The patient was notified in advance and gave permission to proceed with this visit format.  He tolerated radiation treatment relatively well with only minor urinary irritation and modest fatigue.  He did report nocturia 1-2 times per night but specifically denied dysuria, gross hematuria, incomplete bladder emptying or incontinence.  He denied any abdominal pain, nausea, vomiting, diarrhea or constipation.                              On review of systems, the patient states that he is doing very well in general.  His LUTS have pretty much resolved and are back to baseline at this point and he is without complaints.  His energy level is gradually improving and he has been able to remain active.  He specifically denies dysuria, gross hematuria, excessive daytime frequency, urgency, straining to void, incomplete bladder emptying or incontinence.  He denies any abdominal pain or bowel issues and overall, he is quite pleased with his progress to date.  ALLERGIES:  has No Known Allergies.  Meds: Current Outpatient Medications  Medication Sig Dispense Refill   allopurinol (ZYLOPRIM) 300 MG tablet Take 300 mg by mouth daily.     losartan-hydrochlorothiazide (HYZAAR) 100-12.5 MG tablet Take 1 tablet by mouth every morning.      metFORMIN (GLUCOPHAGE) 1000 MG tablet Take 1,000 mg by mouth 2 (two) times daily with a meal.     simvastatin (ZOCOR) 40 MG tablet Take 40 mg by mouth daily.     sitaGLIPtin (JANUVIA) 100 MG tablet Take 100 mg by mouth daily.     No current facility-administered medications for this encounter.    Physical Findings:  vitals were not taken for this visit.  Pain Assessment Pain Score: 0-No pain/10 Unable to assess due to telephone follow-up visit format.  Lab Findings: Lab Results  Component Value Date   WBC 8.6 08/16/2018   HGB 17.0 10/03/2021   HCT 50.0 10/03/2021   MCV 87.8 08/16/2018   PLT 137 (L) 08/16/2018     Radiographic Findings: No results found.  Impression/Plan: 1. 71 y.o. gentleman with Stage T1c adenocarcinoma of the prostate with Gleason score of 3+3, and PSA of 5.86. He will continue to follow up with urology for ongoing PSA determinations but does not currently have any scheduled follow-up with Dr. Lovena Neighbours to his knowledge.  I advised that I would share this information with Dr. Lovena Neighbours and that he would likely hear from one of the staff in the urology office in the next week to schedule a follow-up visit around [facility] for his first posttreatment PSA.  He understands what to expect with regards to PSA monitoring going forward. I will look forward to following his response to  treatment via correspondence with urology, and would be happy to continue to participate in his care if clinically indicated. I talked to the patient about what to expect in the future, including his risk for erectile dysfunction and rectal bleeding. I encouraged him to call or return to the office if he has any questions regarding his previous radiation or possible radiation side effects. He was comfortable with this plan and will follow up as needed.     Garrett Johns, PA-C

## 2022-01-28 DIAGNOSIS — C61 Malignant neoplasm of prostate: Secondary | ICD-10-CM | POA: Diagnosis not present

## 2022-02-02 DIAGNOSIS — N400 Enlarged prostate without lower urinary tract symptoms: Secondary | ICD-10-CM | POA: Diagnosis not present

## 2022-02-02 DIAGNOSIS — C61 Malignant neoplasm of prostate: Secondary | ICD-10-CM | POA: Diagnosis not present

## 2022-02-02 DIAGNOSIS — N528 Other male erectile dysfunction: Secondary | ICD-10-CM | POA: Diagnosis not present

## 2022-02-10 ENCOUNTER — Encounter: Payer: Self-pay | Admitting: *Deleted

## 2022-04-03 ENCOUNTER — Encounter: Payer: Self-pay | Admitting: *Deleted

## 2022-04-08 DIAGNOSIS — M545 Low back pain, unspecified: Secondary | ICD-10-CM | POA: Diagnosis not present

## 2022-05-13 DIAGNOSIS — D696 Thrombocytopenia, unspecified: Secondary | ICD-10-CM | POA: Diagnosis not present

## 2022-05-13 DIAGNOSIS — I739 Peripheral vascular disease, unspecified: Secondary | ICD-10-CM | POA: Diagnosis not present

## 2022-05-13 DIAGNOSIS — E7800 Pure hypercholesterolemia, unspecified: Secondary | ICD-10-CM | POA: Diagnosis not present

## 2022-05-13 DIAGNOSIS — N1831 Chronic kidney disease, stage 3a: Secondary | ICD-10-CM | POA: Diagnosis not present

## 2022-05-13 DIAGNOSIS — I129 Hypertensive chronic kidney disease with stage 1 through stage 4 chronic kidney disease, or unspecified chronic kidney disease: Secondary | ICD-10-CM | POA: Diagnosis not present

## 2022-05-13 DIAGNOSIS — M109 Gout, unspecified: Secondary | ICD-10-CM | POA: Diagnosis not present

## 2022-05-13 DIAGNOSIS — Z8546 Personal history of malignant neoplasm of prostate: Secondary | ICD-10-CM | POA: Diagnosis not present

## 2022-05-13 DIAGNOSIS — E1122 Type 2 diabetes mellitus with diabetic chronic kidney disease: Secondary | ICD-10-CM | POA: Diagnosis not present

## 2022-05-13 DIAGNOSIS — Z23 Encounter for immunization: Secondary | ICD-10-CM | POA: Diagnosis not present

## 2022-05-27 DIAGNOSIS — C61 Malignant neoplasm of prostate: Secondary | ICD-10-CM | POA: Diagnosis not present

## 2022-05-29 DIAGNOSIS — C61 Malignant neoplasm of prostate: Secondary | ICD-10-CM | POA: Diagnosis not present

## 2022-11-24 DIAGNOSIS — C61 Malignant neoplasm of prostate: Secondary | ICD-10-CM | POA: Diagnosis not present

## 2022-12-23 DIAGNOSIS — M109 Gout, unspecified: Secondary | ICD-10-CM | POA: Diagnosis not present

## 2022-12-23 DIAGNOSIS — N1831 Chronic kidney disease, stage 3a: Secondary | ICD-10-CM | POA: Diagnosis not present

## 2022-12-23 DIAGNOSIS — E1122 Type 2 diabetes mellitus with diabetic chronic kidney disease: Secondary | ICD-10-CM | POA: Diagnosis not present

## 2022-12-23 DIAGNOSIS — Z0000 Encounter for general adult medical examination without abnormal findings: Secondary | ICD-10-CM | POA: Diagnosis not present

## 2022-12-23 DIAGNOSIS — D696 Thrombocytopenia, unspecified: Secondary | ICD-10-CM | POA: Diagnosis not present

## 2022-12-23 DIAGNOSIS — E7800 Pure hypercholesterolemia, unspecified: Secondary | ICD-10-CM | POA: Diagnosis not present

## 2022-12-23 DIAGNOSIS — I10 Essential (primary) hypertension: Secondary | ICD-10-CM | POA: Diagnosis not present

## 2022-12-23 DIAGNOSIS — E1151 Type 2 diabetes mellitus with diabetic peripheral angiopathy without gangrene: Secondary | ICD-10-CM | POA: Diagnosis not present

## 2022-12-23 DIAGNOSIS — Z8546 Personal history of malignant neoplasm of prostate: Secondary | ICD-10-CM | POA: Diagnosis not present

## 2023-03-01 DIAGNOSIS — I10 Essential (primary) hypertension: Secondary | ICD-10-CM | POA: Diagnosis not present

## 2023-03-01 DIAGNOSIS — E1122 Type 2 diabetes mellitus with diabetic chronic kidney disease: Secondary | ICD-10-CM | POA: Diagnosis not present

## 2023-03-01 DIAGNOSIS — R42 Dizziness and giddiness: Secondary | ICD-10-CM | POA: Diagnosis not present

## 2023-03-01 DIAGNOSIS — Z23 Encounter for immunization: Secondary | ICD-10-CM | POA: Diagnosis not present

## 2023-05-21 DIAGNOSIS — M109 Gout, unspecified: Secondary | ICD-10-CM | POA: Diagnosis not present

## 2023-05-21 DIAGNOSIS — M1990 Unspecified osteoarthritis, unspecified site: Secondary | ICD-10-CM | POA: Diagnosis not present

## 2023-05-21 DIAGNOSIS — E785 Hyperlipidemia, unspecified: Secondary | ICD-10-CM | POA: Diagnosis not present

## 2023-05-21 DIAGNOSIS — R269 Unspecified abnormalities of gait and mobility: Secondary | ICD-10-CM | POA: Diagnosis not present

## 2023-05-21 DIAGNOSIS — N529 Male erectile dysfunction, unspecified: Secondary | ICD-10-CM | POA: Diagnosis not present

## 2023-05-21 DIAGNOSIS — N400 Enlarged prostate without lower urinary tract symptoms: Secondary | ICD-10-CM | POA: Diagnosis not present

## 2023-05-21 DIAGNOSIS — I129 Hypertensive chronic kidney disease with stage 1 through stage 4 chronic kidney disease, or unspecified chronic kidney disease: Secondary | ICD-10-CM | POA: Diagnosis not present

## 2023-05-21 DIAGNOSIS — N189 Chronic kidney disease, unspecified: Secondary | ICD-10-CM | POA: Diagnosis not present

## 2023-05-21 DIAGNOSIS — K08109 Complete loss of teeth, unspecified cause, unspecified class: Secondary | ICD-10-CM | POA: Diagnosis not present

## 2023-05-24 DIAGNOSIS — C61 Malignant neoplasm of prostate: Secondary | ICD-10-CM | POA: Diagnosis not present

## 2023-05-31 DIAGNOSIS — C61 Malignant neoplasm of prostate: Secondary | ICD-10-CM | POA: Diagnosis not present

## 2023-06-29 DIAGNOSIS — E1122 Type 2 diabetes mellitus with diabetic chronic kidney disease: Secondary | ICD-10-CM | POA: Diagnosis not present

## 2023-06-29 DIAGNOSIS — E7800 Pure hypercholesterolemia, unspecified: Secondary | ICD-10-CM | POA: Diagnosis not present

## 2023-06-29 DIAGNOSIS — N1831 Chronic kidney disease, stage 3a: Secondary | ICD-10-CM | POA: Diagnosis not present

## 2023-06-29 DIAGNOSIS — I10 Essential (primary) hypertension: Secondary | ICD-10-CM | POA: Diagnosis not present

## 2023-11-29 DIAGNOSIS — C61 Malignant neoplasm of prostate: Secondary | ICD-10-CM | POA: Diagnosis not present

## 2024-01-12 DIAGNOSIS — D696 Thrombocytopenia, unspecified: Secondary | ICD-10-CM | POA: Diagnosis not present

## 2024-01-12 DIAGNOSIS — E7800 Pure hypercholesterolemia, unspecified: Secondary | ICD-10-CM | POA: Diagnosis not present

## 2024-01-12 DIAGNOSIS — I10 Essential (primary) hypertension: Secondary | ICD-10-CM | POA: Diagnosis not present

## 2024-01-12 DIAGNOSIS — I739 Peripheral vascular disease, unspecified: Secondary | ICD-10-CM | POA: Diagnosis not present

## 2024-01-12 DIAGNOSIS — M109 Gout, unspecified: Secondary | ICD-10-CM | POA: Diagnosis not present

## 2024-01-12 DIAGNOSIS — N1831 Chronic kidney disease, stage 3a: Secondary | ICD-10-CM | POA: Diagnosis not present

## 2024-01-12 DIAGNOSIS — E1122 Type 2 diabetes mellitus with diabetic chronic kidney disease: Secondary | ICD-10-CM | POA: Diagnosis not present

## 2024-01-12 DIAGNOSIS — Z1331 Encounter for screening for depression: Secondary | ICD-10-CM | POA: Diagnosis not present

## 2024-01-12 DIAGNOSIS — E559 Vitamin D deficiency, unspecified: Secondary | ICD-10-CM | POA: Diagnosis not present

## 2024-01-12 DIAGNOSIS — Z0000 Encounter for general adult medical examination without abnormal findings: Secondary | ICD-10-CM | POA: Diagnosis not present

## 2024-05-15 DIAGNOSIS — C61 Malignant neoplasm of prostate: Secondary | ICD-10-CM | POA: Diagnosis not present
